# Patient Record
Sex: Female | Born: 1965 | Race: White | Hispanic: Yes | Marital: Married | Smoking: Never smoker
Health system: Southern US, Community
[De-identification: ages and names within clinical notes are randomized; demographics above are authoritative.]

## PROBLEM LIST (undated history)

## (undated) DIAGNOSIS — J069 Acute upper respiratory infection, unspecified: Secondary | ICD-10-CM

## (undated) DIAGNOSIS — R51 Headache: Secondary | ICD-10-CM

## (undated) DIAGNOSIS — N951 Menopausal and female climacteric states: Secondary | ICD-10-CM

## (undated) DIAGNOSIS — F419 Anxiety disorder, unspecified: Secondary | ICD-10-CM

## (undated) DIAGNOSIS — J45909 Unspecified asthma, uncomplicated: Secondary | ICD-10-CM

## (undated) DIAGNOSIS — F32A Depression, unspecified: Secondary | ICD-10-CM

## (undated) DIAGNOSIS — F329 Major depressive disorder, single episode, unspecified: Secondary | ICD-10-CM

## (undated) HISTORY — DX: Acute upper respiratory infection, unspecified: J06.9

---

## 1998-08-27 ENCOUNTER — Other Ambulatory Visit: Admission: RE | Admit: 1998-08-27 | Discharge: 1998-08-27 | Payer: Self-pay | Admitting: Obstetrics and Gynecology

## 1999-11-03 ENCOUNTER — Other Ambulatory Visit: Admission: RE | Admit: 1999-11-03 | Discharge: 1999-11-03 | Payer: Self-pay | Admitting: Obstetrics and Gynecology

## 2000-05-28 ENCOUNTER — Ambulatory Visit (HOSPITAL_COMMUNITY): Admission: RE | Admit: 2000-05-28 | Discharge: 2000-05-28 | Payer: Self-pay | Admitting: Obstetrics and Gynecology

## 2000-10-23 ENCOUNTER — Inpatient Hospital Stay (HOSPITAL_COMMUNITY): Admission: AD | Admit: 2000-10-23 | Discharge: 2000-10-25 | Payer: Self-pay | Admitting: Obstetrics and Gynecology

## 2000-11-16 ENCOUNTER — Other Ambulatory Visit: Admission: RE | Admit: 2000-11-16 | Discharge: 2000-11-16 | Payer: Self-pay | Admitting: Obstetrics and Gynecology

## 2001-07-20 ENCOUNTER — Encounter: Payer: Self-pay | Admitting: Allergy and Immunology

## 2001-07-20 ENCOUNTER — Encounter: Admission: RE | Admit: 2001-07-20 | Discharge: 2001-07-20 | Payer: Self-pay | Admitting: Allergy and Immunology

## 2004-01-24 ENCOUNTER — Other Ambulatory Visit: Admission: RE | Admit: 2004-01-24 | Discharge: 2004-01-24 | Payer: Self-pay | Admitting: Obstetrics and Gynecology

## 2005-02-05 ENCOUNTER — Other Ambulatory Visit: Admission: RE | Admit: 2005-02-05 | Discharge: 2005-02-05 | Payer: Self-pay | Admitting: Obstetrics and Gynecology

## 2005-05-06 ENCOUNTER — Ambulatory Visit (HOSPITAL_COMMUNITY): Admission: RE | Admit: 2005-05-06 | Discharge: 2005-05-06 | Payer: Self-pay | Admitting: Obstetrics and Gynecology

## 2006-03-30 ENCOUNTER — Other Ambulatory Visit: Admission: RE | Admit: 2006-03-30 | Discharge: 2006-03-30 | Payer: Self-pay | Admitting: Obstetrics and Gynecology

## 2006-11-05 ENCOUNTER — Ambulatory Visit (HOSPITAL_COMMUNITY): Admission: RE | Admit: 2006-11-05 | Discharge: 2006-11-05 | Payer: Self-pay | Admitting: Family Medicine

## 2006-11-22 ENCOUNTER — Encounter: Admission: RE | Admit: 2006-11-22 | Discharge: 2006-11-22 | Payer: Self-pay | Admitting: Family Medicine

## 2007-03-31 ENCOUNTER — Other Ambulatory Visit: Admission: RE | Admit: 2007-03-31 | Discharge: 2007-03-31 | Payer: Self-pay | Admitting: Obstetrics and Gynecology

## 2007-09-29 HISTORY — PX: CARPAL TUNNEL RELEASE: SHX101

## 2008-03-13 ENCOUNTER — Encounter: Admission: RE | Admit: 2008-03-13 | Discharge: 2008-03-13 | Payer: Self-pay | Admitting: Obstetrics and Gynecology

## 2008-04-26 ENCOUNTER — Other Ambulatory Visit: Admission: RE | Admit: 2008-04-26 | Discharge: 2008-04-26 | Payer: Self-pay | Admitting: Obstetrics and Gynecology

## 2008-09-19 ENCOUNTER — Ambulatory Visit (HOSPITAL_BASED_OUTPATIENT_CLINIC_OR_DEPARTMENT_OTHER): Admission: RE | Admit: 2008-09-19 | Discharge: 2008-09-19 | Payer: Self-pay | Admitting: Orthopedic Surgery

## 2009-04-19 ENCOUNTER — Encounter: Admission: RE | Admit: 2009-04-19 | Discharge: 2009-04-19 | Payer: Self-pay | Admitting: Family Medicine

## 2009-05-01 ENCOUNTER — Encounter: Admission: RE | Admit: 2009-05-01 | Discharge: 2009-05-01 | Payer: Self-pay | Admitting: Family Medicine

## 2009-05-16 ENCOUNTER — Other Ambulatory Visit: Admission: RE | Admit: 2009-05-16 | Discharge: 2009-05-16 | Payer: Self-pay | Admitting: Obstetrics and Gynecology

## 2010-05-09 ENCOUNTER — Other Ambulatory Visit: Admission: RE | Admit: 2010-05-09 | Discharge: 2010-05-09 | Payer: Self-pay | Admitting: Obstetrics and Gynecology

## 2010-05-13 ENCOUNTER — Encounter: Admission: RE | Admit: 2010-05-13 | Discharge: 2010-05-13 | Payer: Self-pay | Admitting: Obstetrics and Gynecology

## 2010-10-19 ENCOUNTER — Encounter: Payer: Self-pay | Admitting: Family Medicine

## 2011-02-10 NOTE — Op Note (Signed)
Colleen House, HITT                ACCOUNT NO.:  [id number]   MEDICAL RECORD NO.:  [id number]          PATIENT TYPE:  AMB   LOCATION:  [facility]SC                          FACILITY:  [facility]   PHYSICIAN:  Cindee Salt, M.D.       DATE OF BIRTH:  April 25, 1966   DATE OF PROCEDURE:  09/19/2008  DATE OF DISCHARGE:                               OPERATIVE REPORT   PREOPERATIVE DIAGNOSIS:  Carpal tunnel syndrome, right hand.   POSTOPERATIVE DIAGNOSIS:  Carpal tunnel syndrome, right hand.   OPERATION:  Decompression of right median nerve.   SURGEON:  Cindee Salt, MD   ASSISTANT:  Joaquin Courts, RN   ANESTHESIA:  Forearm-based IV regional.   DATE OF OPERATION:  September 19, 2008.   ANESTHESIOLOGIST:  Guadalupe Maple, MD.   HISTORY:  The patient is a 45 year old female with a history of carpal  tunnel syndrome, EMG nerve conductions positive which has not responded  to conservative treatment.  She has elected to undergo surgical  decompression.  Postoperative course was discussed along with risks and  complications.  She is aware that there is no guarantee with surgery,  possibility of infection, recurrence, injury to arteries, nerves, and  tendons, incomplete relief of symptoms, and dystrophy.  In the  preoperative area, the patient is seen.  The extremity marked by both  the patient and surgeon.  Antibiotic given.   PROCEDURE:  The patient was brought to the operating room where a  forearm-based IV regional anesthetic was carried out without difficulty.  She was prepped using DuraPrep, supine position, right arm free.  A time-  out was taken.  A longitudinal incision was made in the palm, carried  down through subcutaneous tissue.  Bleeders were electrocauterized.  Palmar fascia was split.  Superficial palmar arch identified.  The  flexor tendon to ring little finger identified to the ulnar side of the  median nerve.  Carpal retinaculum was incised with sharp dissection.  Right angle and Sewall  retractor were placed between skin and forearm  fascia.  Fascia was released for approximately a centimeter and half  proximal to the wrist crease under direct vision.  The canal was  explored.  Air compression to the nerve was apparent.  No further  lesions were identified.  The wound was irrigated.  Skin was closed with  interrupted 5-0 Vicryl Rapide sutures.  A sterile compressive dressing  and wrist splint were applied.  The patient tolerated the procedure well  and was taken to the recovery room for observation in satisfactory  condition.  She will be discharged home to return to [facility] of  [place] in 1 week on Vicodin.           ______________________________  Cindee Salt, M.D.     GK/MEDQ  D:  09/19/2008  T:  09/19/2008  Job:  [id number]   cc:   Dellis Anes. Idell Pickles, M.D.

## 2011-05-15 ENCOUNTER — Other Ambulatory Visit (HOSPITAL_COMMUNITY)
Admission: RE | Admit: 2011-05-15 | Discharge: 2011-05-15 | Disposition: A | Discharge status: Home / Self Care | Payer: BC Managed Care – PPO | Source: Ambulatory Visit | Attending: Emergency Medicine | Admitting: Emergency Medicine

## 2011-05-15 ENCOUNTER — Other Ambulatory Visit: Payer: Self-pay | Admitting: Obstetrics and Gynecology

## 2011-05-15 DIAGNOSIS — Z1231 Encounter for screening mammogram for malignant neoplasm of breast: Secondary | ICD-10-CM

## 2011-05-15 DIAGNOSIS — Z01419 Encounter for gynecological examination (general) (routine) without abnormal findings: Secondary | ICD-10-CM | POA: Insufficient documentation

## 2011-05-20 ENCOUNTER — Ambulatory Visit
Admission: RE | Admit: 2011-05-20 | Discharge: 2011-05-20 | Disposition: A | Discharge status: Home / Self Care | Payer: BC Managed Care – PPO | Source: Ambulatory Visit | Attending: Obstetrics and Gynecology | Admitting: Obstetrics and Gynecology

## 2011-05-20 DIAGNOSIS — Z1231 Encounter for screening mammogram for malignant neoplasm of breast: Secondary | ICD-10-CM

## 2012-05-12 ENCOUNTER — Other Ambulatory Visit (HOSPITAL_COMMUNITY)
Admission: RE | Admit: 2012-05-12 | Discharge: 2012-05-12 | Disposition: A | Discharge status: Home / Self Care | Payer: BC Managed Care – PPO | Source: Ambulatory Visit | Attending: Obstetrics and Gynecology | Admitting: Obstetrics and Gynecology

## 2012-05-12 ENCOUNTER — Other Ambulatory Visit: Payer: Self-pay | Admitting: Obstetrics and Gynecology

## 2012-05-12 DIAGNOSIS — Z01419 Encounter for gynecological examination (general) (routine) without abnormal findings: Secondary | ICD-10-CM | POA: Insufficient documentation

## 2012-05-12 DIAGNOSIS — Z1151 Encounter for screening for human papillomavirus (HPV): Secondary | ICD-10-CM | POA: Insufficient documentation

## 2012-06-28 IMAGING — MG MM DIGITAL SCREENING {BCG}
4 series · 4 of 4 positions shown · non-contrast
Comparison: Prior studies.

DG SCREEN MAMMOGRAM BILATERAL
Bilateral CC and MLO view(s) were taken.

DIGITAL SCREENING MAMMOGRAM WITH CAD:

[R CC]
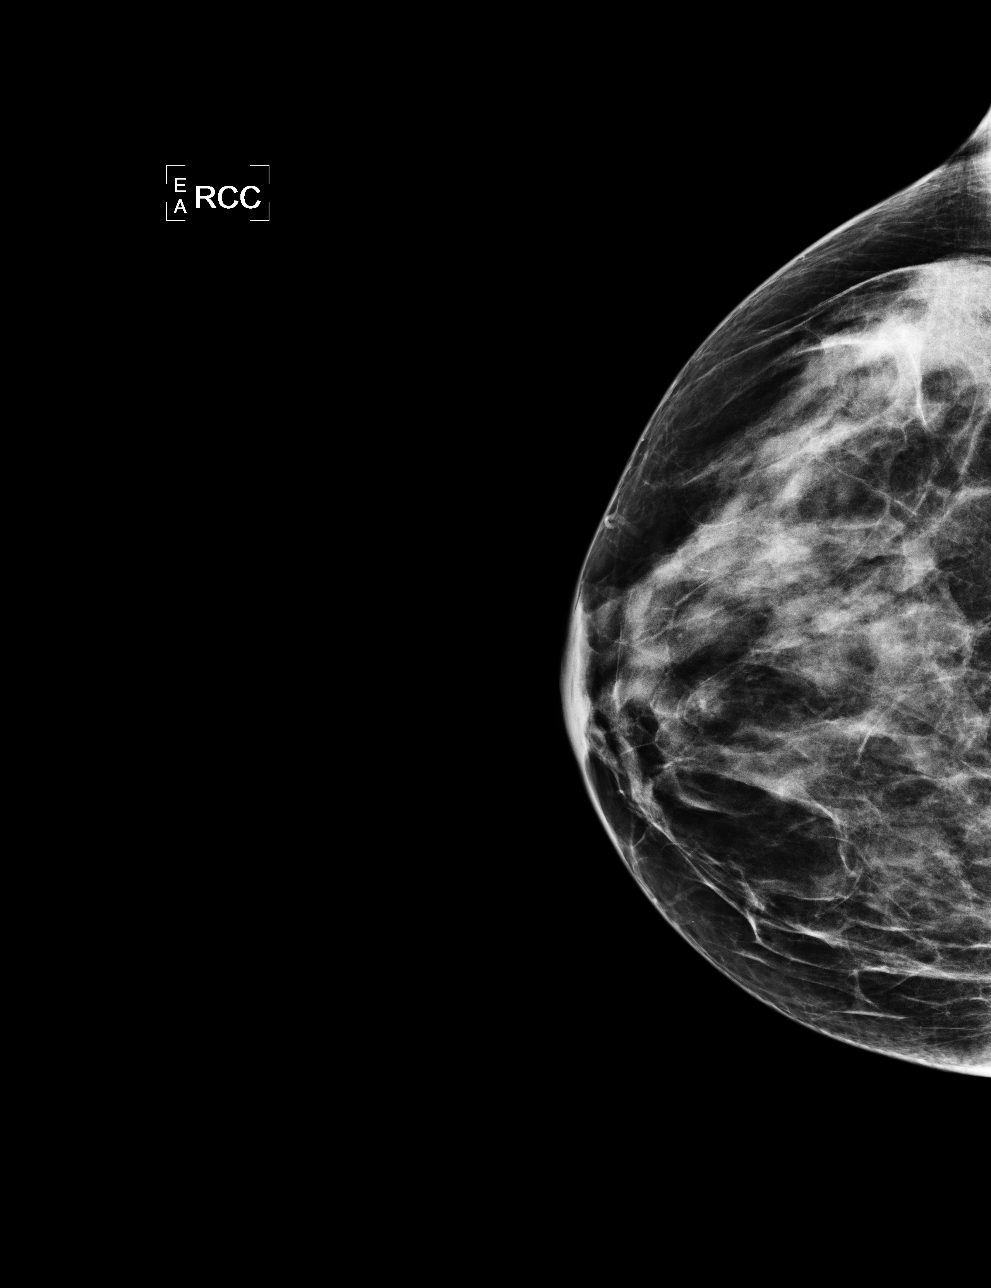

[L CC]
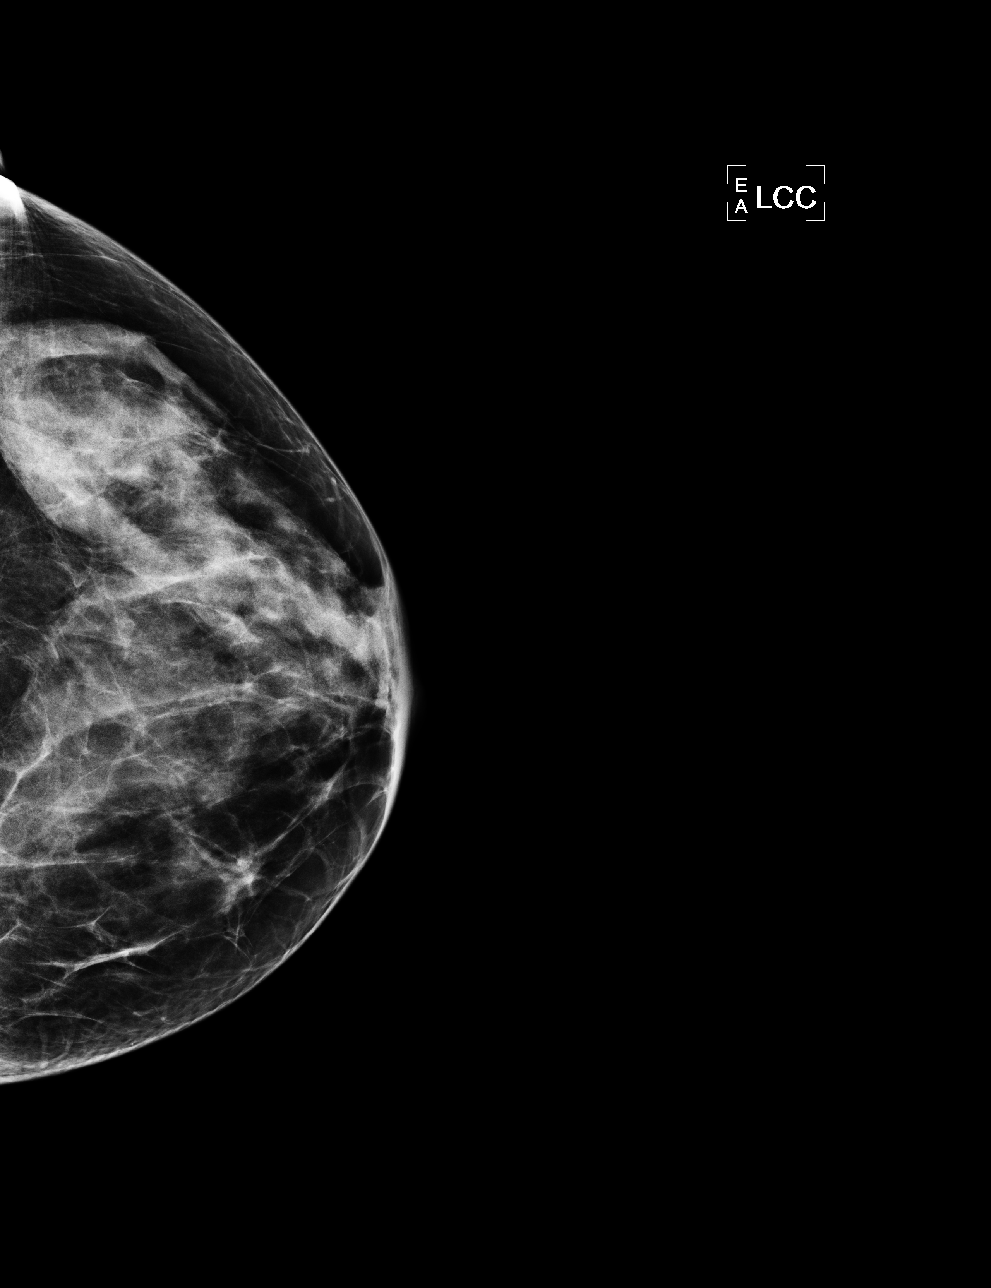

[L MLO]
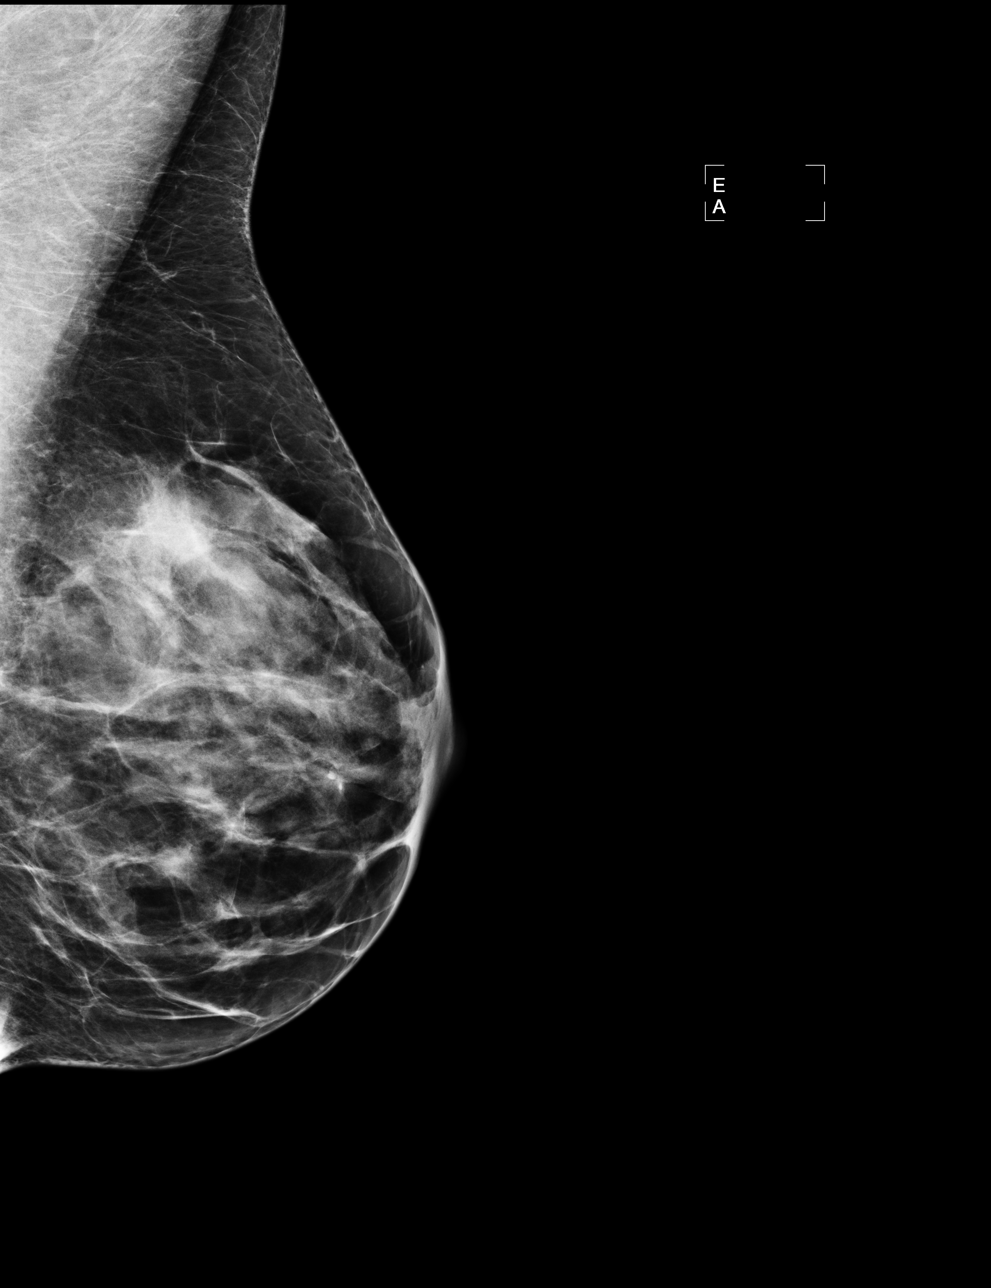

[R MLO]
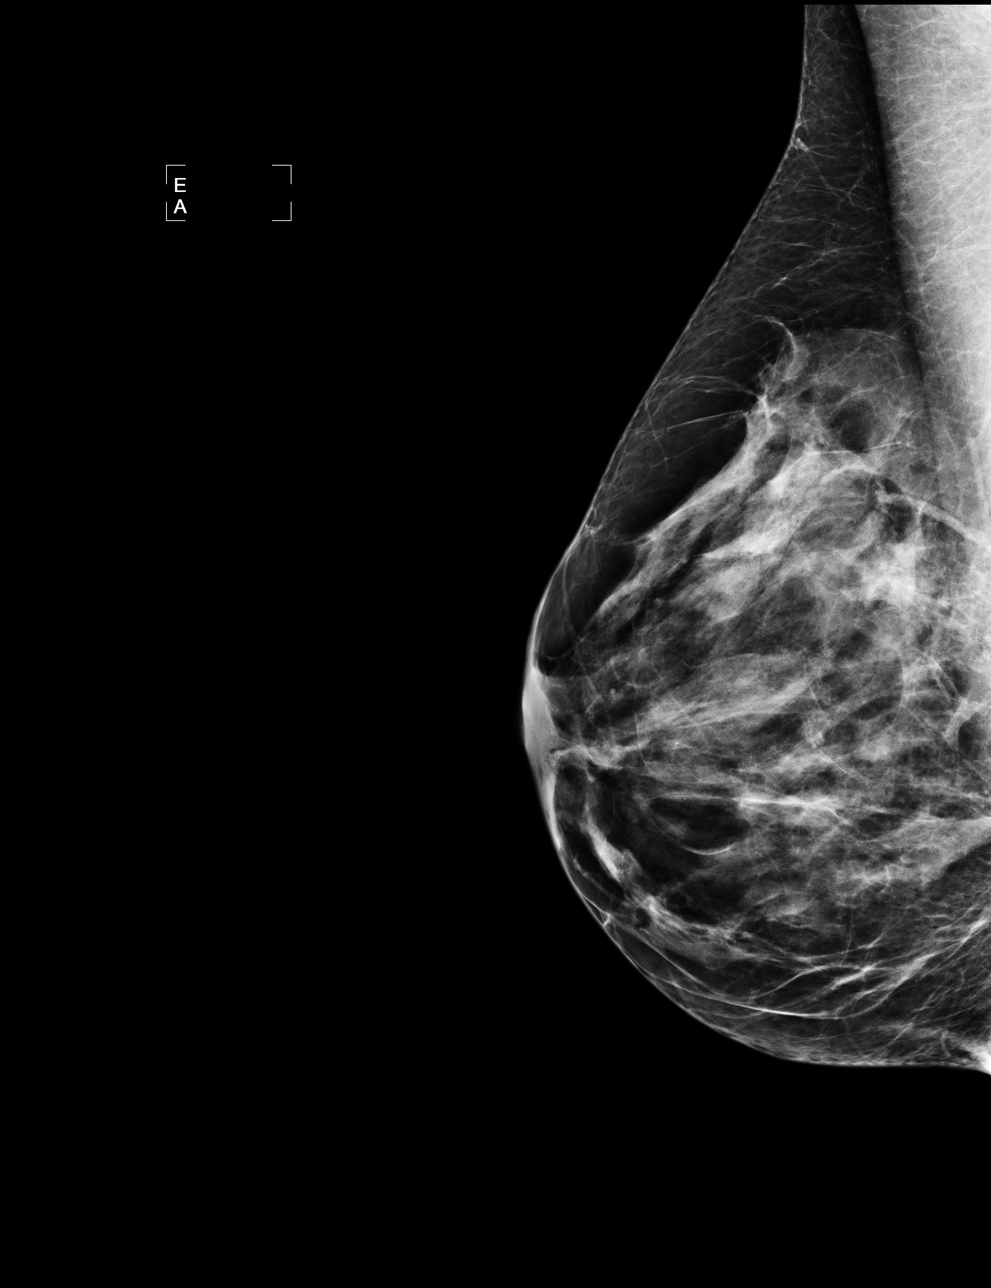

[4 of 4 positions shown; findings below may reference images not displayed]

The breast tissue is heterogeneously dense.  There is no dominant mass, architectural distortion or
calcification to suggest malignancy.

Images were processed with CAD.
IMPRESSION: No mammographic evidence of malignancy.  Suggest yearly screening mammography.

A result letter of this screening mammogram will be mailed directly to the patient.

ASSESSMENT: Negative - BI-RADS 1

Screening mammogram in 1 year.
,

## 2012-08-22 ENCOUNTER — Other Ambulatory Visit: Payer: Self-pay | Admitting: Obstetrics and Gynecology

## 2012-08-22 DIAGNOSIS — Z1231 Encounter for screening mammogram for malignant neoplasm of breast: Secondary | ICD-10-CM

## 2012-11-04 ENCOUNTER — Ambulatory Visit: Payer: BC Managed Care – PPO

## 2013-07-12 ENCOUNTER — Other Ambulatory Visit: Payer: Self-pay | Admitting: Orthopedic Surgery

## 2013-07-13 ENCOUNTER — Encounter (HOSPITAL_BASED_OUTPATIENT_CLINIC_OR_DEPARTMENT_OTHER): Payer: Self-pay | Admitting: *Deleted

## 2013-07-13 NOTE — Progress Notes (Signed)
No labs needed

## 2013-07-17 ENCOUNTER — Ambulatory Visit (HOSPITAL_BASED_OUTPATIENT_CLINIC_OR_DEPARTMENT_OTHER): Payer: BC Managed Care – PPO | Admitting: Anesthesiology

## 2013-07-17 ENCOUNTER — Encounter (HOSPITAL_BASED_OUTPATIENT_CLINIC_OR_DEPARTMENT_OTHER): Payer: BC Managed Care – PPO | Admitting: Anesthesiology

## 2013-07-17 ENCOUNTER — Encounter (HOSPITAL_BASED_OUTPATIENT_CLINIC_OR_DEPARTMENT_OTHER): Payer: Self-pay | Admitting: *Deleted

## 2013-07-17 ENCOUNTER — Ambulatory Visit (HOSPITAL_BASED_OUTPATIENT_CLINIC_OR_DEPARTMENT_OTHER)
Admission: RE | Admit: 2013-07-17 | Discharge: 2013-07-17 | Disposition: A | Discharge status: Home / Self Care | Payer: BC Managed Care – PPO | Source: Ambulatory Visit | Attending: Orthopedic Surgery | Admitting: Orthopedic Surgery

## 2013-07-17 ENCOUNTER — Encounter (HOSPITAL_BASED_OUTPATIENT_CLINIC_OR_DEPARTMENT_OTHER)
Admission: RE | Disposition: A | Discharge status: Home / Self Care | Payer: Self-pay | Source: Ambulatory Visit | Attending: Orthopedic Surgery

## 2013-07-17 DIAGNOSIS — J45909 Unspecified asthma, uncomplicated: Secondary | ICD-10-CM | POA: Insufficient documentation

## 2013-07-17 DIAGNOSIS — Z79899 Other long term (current) drug therapy: Secondary | ICD-10-CM | POA: Insufficient documentation

## 2013-07-17 DIAGNOSIS — Z01812 Encounter for preprocedural laboratory examination: Secondary | ICD-10-CM | POA: Insufficient documentation

## 2013-07-17 DIAGNOSIS — F411 Generalized anxiety disorder: Secondary | ICD-10-CM | POA: Insufficient documentation

## 2013-07-17 DIAGNOSIS — R51 Headache: Secondary | ICD-10-CM | POA: Insufficient documentation

## 2013-07-17 DIAGNOSIS — F329 Major depressive disorder, single episode, unspecified: Secondary | ICD-10-CM | POA: Insufficient documentation

## 2013-07-17 DIAGNOSIS — M6530 Trigger finger, unspecified finger: Secondary | ICD-10-CM | POA: Insufficient documentation

## 2013-07-17 DIAGNOSIS — F3289 Other specified depressive episodes: Secondary | ICD-10-CM | POA: Insufficient documentation

## 2013-07-17 HISTORY — PX: TRIGGER FINGER RELEASE: SHX641

## 2013-07-17 HISTORY — DX: Major depressive disorder, single episode, unspecified: F32.9

## 2013-07-17 HISTORY — DX: Headache: R51

## 2013-07-17 HISTORY — DX: Anxiety disorder, unspecified: F41.9

## 2013-07-17 HISTORY — DX: Unspecified asthma, uncomplicated: J45.909

## 2013-07-17 HISTORY — DX: Depression, unspecified: F32.A

## 2013-07-17 HISTORY — DX: Menopausal and female climacteric states: N95.1

## 2013-07-17 SURGERY — RELEASE, A1 PULLEY, FOR TRIGGER FINGER
Anesthesia: General | Site: Hand | Laterality: Right | Wound class: Clean

## 2013-07-17 MED ORDER — CEFAZOLIN SODIUM-DEXTROSE 2-3 GM-% IV SOLR
INTRAVENOUS | Status: AC
  Filled 2013-07-17: qty 50

## 2013-07-17 MED ORDER — CHLORHEXIDINE GLUCONATE 4 % EX LIQD: 60.0000 mL | Freq: Once | CUTANEOUS | Status: DC

## 2013-07-17 MED ORDER — CEFAZOLIN SODIUM-DEXTROSE 2-3 GM-% IV SOLR
2.0000 g | INTRAVENOUS | Status: AC
  Administered 2013-07-17: 2 g via INTRAVENOUS

## 2013-07-17 MED ORDER — FENTANYL CITRATE 0.05 MG/ML IJ SOLN
INTRAMUSCULAR | Status: AC
  Filled 2013-07-17: qty 2

## 2013-07-17 MED ORDER — LIDOCAINE HCL (PF) 0.5 % IJ SOLN
INTRAMUSCULAR | Status: DC | PRN
  Administered 2013-07-17: 35 mL via INTRAVENOUS

## 2013-07-17 MED ORDER — BUPIVACAINE HCL (PF) 0.25 % IJ SOLN
INTRAMUSCULAR | Status: AC
  Filled 2013-07-17: qty 30

## 2013-07-17 MED ORDER — LACTATED RINGERS IV SOLN
INTRAVENOUS | Status: DC
  Administered 2013-07-17: 13:00:00 via INTRAVENOUS

## 2013-07-17 MED ORDER — ONDANSETRON HCL 4 MG/2ML IJ SOLN
INTRAMUSCULAR | Status: DC | PRN
  Administered 2013-07-17: 4 mg via INTRAVENOUS

## 2013-07-17 MED ORDER — MIDAZOLAM HCL 2 MG/2ML IJ SOLN
INTRAMUSCULAR | Status: AC
  Filled 2013-07-17: qty 2

## 2013-07-17 MED ORDER — BUPIVACAINE HCL (PF) 0.25 % IJ SOLN
INTRAMUSCULAR | Status: DC | PRN
  Administered 2013-07-17: 3.5 mL

## 2013-07-17 MED ORDER — MIDAZOLAM HCL 5 MG/5ML IJ SOLN
INTRAMUSCULAR | Status: DC | PRN
  Administered 2013-07-17: 2 mg via INTRAVENOUS

## 2013-07-17 MED ORDER — LIDOCAINE HCL (PF) 1 % IJ SOLN
INTRAMUSCULAR | Status: AC
  Filled 2013-07-17: qty 30

## 2013-07-17 MED ORDER — PROPOFOL 10 MG/ML IV BOLUS
INTRAVENOUS | Status: DC | PRN
  Administered 2013-07-17: 30 mg via INTRAVENOUS

## 2013-07-17 MED ORDER — HYDROMORPHONE HCL PF 1 MG/ML IJ SOLN: 0.2500 mg | INTRAMUSCULAR | Status: DC | PRN

## 2013-07-17 MED ORDER — KETOROLAC TROMETHAMINE 30 MG/ML IJ SOLN
INTRAMUSCULAR | Status: DC | PRN
  Administered 2013-07-17: 30 mg via INTRAVENOUS

## 2013-07-17 MED ORDER — KETOROLAC TROMETHAMINE 30 MG/ML IJ SOLN: 15.0000 mg | Freq: Once | INTRAMUSCULAR | Status: DC | PRN

## 2013-07-17 MED ORDER — LIDOCAINE HCL (CARDIAC) 20 MG/ML IV SOLN
INTRAVENOUS | Status: DC | PRN
  Administered 2013-07-17: 30 mg via INTRAVENOUS

## 2013-07-17 MED ORDER — FENTANYL CITRATE 0.05 MG/ML IJ SOLN
INTRAMUSCULAR | Status: DC | PRN
  Administered 2013-07-17 (×2): 50 ug via INTRAVENOUS

## 2013-07-17 MED ORDER — ONDANSETRON HCL 4 MG/2ML IJ SOLN: 4.0000 mg | Freq: Once | INTRAMUSCULAR | Status: DC | PRN

## 2013-07-17 MED ORDER — PROPOFOL 10 MG/ML IV BOLUS
INTRAVENOUS | Status: AC
  Filled 2013-07-17: qty 20

## 2013-07-17 MED ORDER — HYDROCODONE-ACETAMINOPHEN 5-325 MG PO TABS: 1.0000 | ORAL_TABLET | Freq: Four times a day (QID) | ORAL | Status: DC | PRN

## 2013-07-17 SURGICAL SUPPLY — 33 items
BANDAGE COBAN STERILE 2 (GAUZE/BANDAGES/DRESSINGS) ×2 IMPLANT
BLADE SURG 15 STRL LF DISP TIS (BLADE) ×1 IMPLANT
BLADE SURG 15 STRL SS (BLADE) ×2
BNDG CMPR 9X4 STRL LF SNTH (GAUZE/BANDAGES/DRESSINGS)
BNDG ESMARK 4X9 LF (GAUZE/BANDAGES/DRESSINGS) IMPLANT
CHLORAPREP W/TINT 26ML (MISCELLANEOUS) ×2 IMPLANT
CORDS BIPOLAR (ELECTRODE) IMPLANT
COVER MAYO STAND STRL (DRAPES) ×2 IMPLANT
COVER TABLE BACK 60X90 (DRAPES) ×2 IMPLANT
CUFF TOURNIQUET SINGLE 18IN (TOURNIQUET CUFF) ×1 IMPLANT
DECANTER SPIKE VIAL GLASS SM (MISCELLANEOUS) IMPLANT
DRAPE EXTREMITY T 121X128X90 (DRAPE) ×2 IMPLANT
DRAPE SURG 17X23 STRL (DRAPES) ×2 IMPLANT
GAUZE XEROFORM 1X8 LF (GAUZE/BANDAGES/DRESSINGS) ×2 IMPLANT
GLOVE BIOGEL PI IND STRL 8.5 (GLOVE) ×1 IMPLANT
GLOVE BIOGEL PI INDICATOR 8.5 (GLOVE) ×1
GLOVE ECLIPSE 6.5 STRL STRAW (GLOVE) ×1 IMPLANT
GLOVE EXAM NITRILE EXT CUFF MD (GLOVE) ×1 IMPLANT
GLOVE SURG ORTHO 8.0 STRL STRW (GLOVE) ×2 IMPLANT
GOWN BRE IMP PREV XXLGXLNG (GOWN DISPOSABLE) ×2 IMPLANT
GOWN PREVENTION PLUS XLARGE (GOWN DISPOSABLE) ×2 IMPLANT
NEEDLE 27GAX1X1/2 (NEEDLE) ×2 IMPLANT
NS IRRIG 1000ML POUR BTL (IV SOLUTION) ×2 IMPLANT
PACK BASIN DAY SURGERY FS (CUSTOM PROCEDURE TRAY) ×2 IMPLANT
PADDING CAST ABS 4INX4YD NS (CAST SUPPLIES)
PADDING CAST ABS COTTON 4X4 ST (CAST SUPPLIES) ×1 IMPLANT
SPONGE GAUZE 4X4 12PLY (GAUZE/BANDAGES/DRESSINGS) ×2 IMPLANT
STOCKINETTE 4X48 STRL (DRAPES) ×2 IMPLANT
SUT VICRYL RAPIDE 4/0 PS 2 (SUTURE) ×2 IMPLANT
SYR BULB 3OZ (MISCELLANEOUS) ×2 IMPLANT
SYR CONTROL 10ML LL (SYRINGE) ×2 IMPLANT
TOWEL OR 17X24 6PK STRL BLUE (TOWEL DISPOSABLE) ×4 IMPLANT
UNDERPAD 30X30 INCONTINENT (UNDERPADS AND DIAPERS) ×2 IMPLANT

## 2013-07-17 NOTE — Brief Op Note (Signed)
07/17/2013  2:03 PM  PATIENT:  Colleen House  47 y.o. female  PRE-OPERATIVE DIAGNOSIS:  STENOSING TENOSYNOVITIS RIGHT THUMB  POST-OPERATIVE DIAGNOSIS:  Stenosing Tenosynovitis Right Thumb  PROCEDURE:  Procedure(s): RELEASE A-1 PULLEY  RIGHT THUMB (Right)  SURGEON:  Surgeon(s) and Role:    * Nicki Reaper, MD - Primary  PHYSICIAN ASSISTANT:   ASSISTANTS: none   ANESTHESIA:   local and regional  EBL:  Total I/O In: 500 [I.V.:500] Out: -   BLOOD ADMINISTERED:none  DRAINS: none   LOCAL MEDICATIONS USED:  MARCAINE     SPECIMEN:  No Specimen  DISPOSITION OF SPECIMEN:  N/A  COUNTS:  YES  TOURNIQUET:   Total Tourniquet Time Documented: Forearm (Right) - 75 minutes Total: Forearm (Right) - 75 minutes   DICTATION: .Other Dictation: Dictation Number [id number]  PLAN OF CARE: Discharge to home after PACU  PATIENT DISPOSITION:  PACU - hemodynamically stable.

## 2013-07-17 NOTE — H&P (Signed)
Colleen House is 47 years-old.  She is right-hand dominant.  She has not been seen in several years.  She complains of pain and catching in her right thumb.  This has been going on since the beginning of April. She recalls no history of injury. She states that she has begun guitar lessons and this may be the underlying etiology.  She complains of catching especially in the morning with sharp pain. She is not complaining of any numbness or tingling. She has worn a splint she obtained which has given her some relief.  She has been taking Aleve.  This has been injected on two occasions with good relief, but it has returned.   PAST MEDICAL HISTORY:  She has history of diabetes, thyroid problems, arthritis or gout.  ALLERGIES:   Erythromycin.    MEDICATIONS:    Lamotrigine, Adderall and escitalopram.    SURGICAL HISTORY:    She has had carpal tunnel release on her right side in December of 2010.   FAMILY MEDICAL HISTORY:    Negative.  SOCIAL HISTORY:     She does not smoke or drink.  She is a Architectural technologist.  REVIEW OF SYSTEMS:    Positive for glasses, asthma, headaches, depression, otherwise negative 14 points. Colleen House is an 47 y.o. female.   Chief Complaint: sts rt thumb HPI: see above  Past Medical History  Diagnosis Date  . Headache(784.0)   . Anxiety   . Depression   . Asthma     very mild-smoke triggers it  . Perimenopausal     Past Surgical History  Procedure Laterality Date  . Carpal tunnel release  2009    right=[facility]ional    History reviewed. No pertinent family history. Social History:  reports that she has never smoked. She does not have any smokeless tobacco history on file. She reports that she drinks alcohol. She reports that she does not use illicit drugs.  Allergies:  Allergies  Allergen Reactions  . Erythromycin     Made her chest hurt    Medications Prior to Admission  Medication Sig Dispense Refill  . eletriptan (RELPAX) 20 MG tablet Take 20  mg by mouth as needed for migraine. One tablet by mouth at onset of headache. May repeat in 2 hours if headache persists or recurs.      Marland Kitchen escitalopram (LEXAPRO) 20 MG tablet Take 20 mg by mouth every evening.      . lamoTRIgine (LAMICTAL) 200 MG tablet Take 200 mg by mouth every evening.      . mometasone (NASONEX) 50 MCG/ACT nasal spray Place 2 sprays into the nose as needed.      Marland Kitchen albuterol (PROVENTIL HFA;VENTOLIN HFA) 108 (90 BASE) MCG/ACT inhaler Inhale 2 puffs into the lungs every 6 (six) hours as needed for wheezing.        No results found for this or any previous visit (from the past 48 hour(s)).  No results found.   Pertinent items are noted in HPI.  Blood pressure 105/72, pulse 72, temperature 98.2 F (36.8 C), temperature source Oral, resp. rate 16, height 5\' 3"  (1.6 m), weight 149 lb (67.586 kg), last menstrual period 05/15/2013, SpO2 99.00%.  General appearance: alert, cooperative and appears stated age Head: Normocephalic, without obvious abnormality Neck: no JVD Resp: clear to auscultation bilaterally Cardio: regular rate and rhythm, S1, S2 normal, no murmur, click, rub or gallop GI: soft, non-tender; bowel sounds normal; no masses,  no organomegaly Extremities: extremities normal, atraumatic, no cyanosis  or edema Pulses: 2+ and symmetric Skin: Skin color, texture, turgor normal. No rashes or lesions Neurologic: Grossly normal Incision/Wound: na  Assessment/Plan DIAGNOSIS:     Stenosing tenosynovitis, right thumb.  Plan Release A-1 Rt thumb  Bertie Mcconathy R 07/17/2013, 1:02 PM

## 2013-07-17 NOTE — Anesthesia Postprocedure Evaluation (Signed)
  Anesthesia Post-op Note  Patient: Colleen House  Procedure(s) Performed: Procedure(s): RELEASE A-1 PULLEY  RIGHT THUMB (Right)  Patient Location: PACU  Anesthesia Type:Bier block  Level of Consciousness: awake, alert  and oriented  Airway and Oxygen Therapy: Patient Spontanous Breathing  Post-op Pain: none  Post-op Assessment: Post-op Vital signs reviewed, Patient's Cardiovascular Status Stable, Respiratory Function Stable, Patent Airway and Pain level controlled  Post-op Vital Signs: stable  Complications: No apparent anesthesia complications

## 2013-07-17 NOTE — Anesthesia Preprocedure Evaluation (Addendum)
Anesthesia Evaluation  Patient identified by MRN, date of birth, ID band Patient awake    Reviewed: Allergy & Precautions, H&P , NPO status , Patient's Chart, lab work & pertinent test results  Airway Mallampati: II TM Distance: >3 FB Neck ROM: Full    Dental  (+) Teeth Intact and Dental Advisory Given   Pulmonary  breath sounds clear to auscultation        Cardiovascular Rhythm:Regular Rate:Normal     Neuro/Psych    GI/Hepatic   Endo/Other    Renal/GU      Musculoskeletal   Abdominal   Peds  Hematology   Anesthesia Other Findings   Reproductive/Obstetrics                           Anesthesia Physical Anesthesia Plan  ASA: II  Anesthesia Plan: General   Post-op Pain Management:    Induction: Intravenous  Airway Management Planned: LMA  Additional Equipment:   Intra-op Plan:   Post-operative Plan: Extubation in OR  Informed Consent: I have reviewed the patients History and Physical, chart, labs and discussed the procedure including the risks, benefits and alternatives for the proposed anesthesia with the patient or authorized representative who has indicated his/her understanding and acceptance.   Dental advisory given  Plan Discussed with: CRNA and Anesthesiologist  Anesthesia Plan Comments:         Anesthesia Quick Evaluation

## 2013-07-17 NOTE — Op Note (Signed)
Dictation Number [id number]

## 2013-07-17 NOTE — Anesthesia Procedure Notes (Signed)
Procedure Name: MAC Performed by: Adasia Hoar W Pre-anesthesia Checklist: Patient identified, Timeout performed, Emergency Drugs available, Suction available and Patient being monitored Patient Re-evaluated:Patient Re-evaluated prior to inductionOxygen Delivery Method: Simple face mask Placement Confirmation: positive ETCO2 Dental Injury: Teeth and Oropharynx as per pre-operative assessment      

## 2013-07-17 NOTE — Transfer of Care (Signed)
Immediate Anesthesia Transfer of Care Note  Patient: Colleen House  Procedure(s) Performed: Procedure(s): RELEASE A-1 PULLEY  RIGHT THUMB (Right)  Patient Location: PACU  Anesthesia Type:MAC and Bier block  Level of Consciousness: awake, alert  and oriented  Airway & Oxygen Therapy: Patient Spontanous Breathing and Patient connected to face mask oxygen  Post-op Assessment: Report given to PACU RN and Post -op Vital signs reviewed and stable  Post vital signs: Reviewed and stable  Complications: No apparent anesthesia complications

## 2013-07-18 ENCOUNTER — Encounter (HOSPITAL_BASED_OUTPATIENT_CLINIC_OR_DEPARTMENT_OTHER): Payer: Self-pay | Admitting: Orthopedic Surgery

## 2013-07-18 NOTE — Op Note (Signed)
NAMEElsie House                ACCOUNT NO.:  [id number]  MEDICAL RECORD NO.:  [id number]  LOCATION:                                 FACILITY:  PHYSICIAN:  Cindee Salt, M.D.            DATE OF BIRTH:  DATE OF PROCEDURE:  07/17/2013 DATE OF DISCHARGE:                              OPERATIVE REPORT   PREOPERATIVE DIAGNOSIS:  Stenosing tenosynovitis, right thumb.  POSTOPERATIVE DIAGNOSIS:  Stenosing tenosynovitis, right thumb.  OPERATION:  Release of A1 pulley, right thumb.  SURGEON:  Cindee Salt, M.D.  ANESTHESIA:  Forearm-based IV regional with local infiltration.  ANESTHESIOLOGIST:  Kaylyn Layer. Michelle Piper, M.D.  HISTORY:  The patient is a 47 year old female with a history of triggering of her right thumb, this is not responded to conservative treatment including two injections.  She has elected to undergo surgical decompression of the A1 pulley.  Pre, peri, and postoperative course have been discussed along with risks and complications.  She is aware that there is no guarantee with surgery; possibility of infection; recurrence of injury to arteries, nerves, tendons; incomplete relief of symptoms and dystrophy.  In the preoperative area, the patient is seen, the extremity marked by both patient and surgeon, and antibiotic given.  DESCRIPTION OF PROCEDURE:  The patient was brought to the operating room where a forearm-based IV regional anesthetic was carried out without difficulty.  She was prepped using ChloraPrep, supine position with the right arm free.  A 3-minute dry time was allowed.  Time-out taken, confirming the patient and procedure.  A transverse incision was made over the A1 pulley of the right thumb.  After local infiltration with 0.25% Marcaine without epinephrine was given, the A1 pulley was identified.  Retractors were placed protecting both radial and ulnar neurovascular bundles.  The A1 pulley was then released on its radial aspect.  The oblique pulley was left  intact.  A partial tenosynovectomy was performed proximally with blunt dissection.  Thumb was placed through a full range motion, no further triggering was noted.  The wound was copiously irrigated with saline and closed with interrupted 4-0 Vicryl Rapide sutures.  A further infiltration with 0.25% Marcaine without epinephrine was given, approximately 4-5 mL was used.  A sterile compressive dressing with the thumb free was applied.  On deflation of the tourniquet, all fingers were immediately pinked.  She was taken to the recovery room for observation in satisfactory condition.  She will be discharged to home to return to the [facility] of [place] in 1 week, on Vicodin.          ______________________________ Cindee Salt, M.D.     GK/MEDQ  D:  07/17/2013  T:  07/18/2013  Job:  [id number]

## 2014-05-01 ENCOUNTER — Other Ambulatory Visit (HOSPITAL_COMMUNITY)
Admission: RE | Admit: 2014-05-01 | Discharge: 2014-05-01 | Disposition: A | Discharge status: Home / Self Care | Payer: BC Managed Care – PPO | Source: Ambulatory Visit | Attending: Obstetrics and Gynecology | Admitting: Obstetrics and Gynecology

## 2014-05-01 ENCOUNTER — Other Ambulatory Visit: Payer: Self-pay | Admitting: Obstetrics and Gynecology

## 2014-05-01 DIAGNOSIS — Z01419 Encounter for gynecological examination (general) (routine) without abnormal findings: Secondary | ICD-10-CM | POA: Insufficient documentation

## 2014-05-03 LAB — CYTOLOGY - PAP

## 2014-07-18 ENCOUNTER — Other Ambulatory Visit: Payer: Self-pay

## 2014-07-18 DIAGNOSIS — Z1231 Encounter for screening mammogram for malignant neoplasm of breast: Secondary | ICD-10-CM

## 2014-11-05 ENCOUNTER — Ambulatory Visit
Admission: RE | Admit: 2014-11-05 | Discharge: 2014-11-05 | Disposition: A | Discharge status: Home / Self Care | Payer: BC Managed Care – PPO | Source: Ambulatory Visit

## 2014-11-05 DIAGNOSIS — Z1231 Encounter for screening mammogram for malignant neoplasm of breast: Secondary | ICD-10-CM

## 2015-05-10 ENCOUNTER — Other Ambulatory Visit: Payer: Self-pay | Admitting: Obstetrics and Gynecology

## 2015-05-10 ENCOUNTER — Other Ambulatory Visit (HOSPITAL_COMMUNITY)
Admission: RE | Admit: 2015-05-10 | Discharge: 2015-05-10 | Disposition: A | Discharge status: Home / Self Care | Payer: BC Managed Care – PPO | Source: Ambulatory Visit | Attending: Obstetrics and Gynecology | Admitting: Obstetrics and Gynecology

## 2015-05-10 DIAGNOSIS — Z01419 Encounter for gynecological examination (general) (routine) without abnormal findings: Secondary | ICD-10-CM | POA: Diagnosis not present

## 2015-05-10 DIAGNOSIS — Z1151 Encounter for screening for human papillomavirus (HPV): Secondary | ICD-10-CM | POA: Diagnosis present

## 2015-05-13 LAB — CYTOLOGY - PAP

## 2015-12-26 ENCOUNTER — Other Ambulatory Visit: Payer: Self-pay

## 2015-12-26 DIAGNOSIS — Z1231 Encounter for screening mammogram for malignant neoplasm of breast: Secondary | ICD-10-CM

## 2016-01-17 ENCOUNTER — Ambulatory Visit
Admission: RE | Admit: 2016-01-17 | Discharge: 2016-01-17 | Disposition: A | Discharge status: Home / Self Care | Payer: BC Managed Care – PPO | Source: Ambulatory Visit

## 2016-01-17 DIAGNOSIS — Z1231 Encounter for screening mammogram for malignant neoplasm of breast: Secondary | ICD-10-CM

## 2017-01-21 ENCOUNTER — Other Ambulatory Visit: Payer: Self-pay | Admitting: Obstetrics and Gynecology

## 2017-01-21 DIAGNOSIS — Z1231 Encounter for screening mammogram for malignant neoplasm of breast: Secondary | ICD-10-CM

## 2017-02-08 ENCOUNTER — Encounter: Payer: Self-pay | Admitting: Radiology

## 2017-02-08 ENCOUNTER — Ambulatory Visit
Admission: RE | Admit: 2017-02-08 | Discharge: 2017-02-08 | Disposition: A | Discharge status: Home / Self Care | Payer: BC Managed Care – PPO | Source: Ambulatory Visit | Attending: Obstetrics and Gynecology | Admitting: Obstetrics and Gynecology

## 2017-02-08 DIAGNOSIS — Z1231 Encounter for screening mammogram for malignant neoplasm of breast: Secondary | ICD-10-CM

## 2018-02-04 ENCOUNTER — Other Ambulatory Visit: Payer: Self-pay | Admitting: Obstetrics and Gynecology

## 2018-02-04 DIAGNOSIS — Z1231 Encounter for screening mammogram for malignant neoplasm of breast: Secondary | ICD-10-CM

## 2018-02-28 ENCOUNTER — Ambulatory Visit
Admission: RE | Admit: 2018-02-28 | Discharge: 2018-02-28 | Disposition: A | Discharge status: Home / Self Care | Payer: BC Managed Care – PPO | Source: Ambulatory Visit | Attending: Obstetrics and Gynecology | Admitting: Obstetrics and Gynecology

## 2018-02-28 DIAGNOSIS — Z1231 Encounter for screening mammogram for malignant neoplasm of breast: Secondary | ICD-10-CM

## 2018-12-06 ENCOUNTER — Other Ambulatory Visit (HOSPITAL_COMMUNITY)
Admission: RE | Admit: 2018-12-06 | Discharge: 2018-12-06 | Disposition: A | Discharge status: Home / Self Care | Payer: BC Managed Care – PPO | Source: Ambulatory Visit | Attending: Obstetrics and Gynecology | Admitting: Obstetrics and Gynecology

## 2018-12-06 ENCOUNTER — Other Ambulatory Visit: Payer: Self-pay | Admitting: Obstetrics and Gynecology

## 2018-12-06 DIAGNOSIS — Z01419 Encounter for gynecological examination (general) (routine) without abnormal findings: Secondary | ICD-10-CM | POA: Diagnosis not present

## 2018-12-08 LAB — CYTOLOGY - PAP
Diagnosis: NEGATIVE
HPV: NOT DETECTED

## 2018-12-27 ENCOUNTER — Other Ambulatory Visit: Payer: Self-pay | Admitting: Obstetrics and Gynecology

## 2018-12-27 DIAGNOSIS — Z1231 Encounter for screening mammogram for malignant neoplasm of breast: Secondary | ICD-10-CM

## 2019-06-02 ENCOUNTER — Ambulatory Visit
Admission: RE | Admit: 2019-06-02 | Discharge: 2019-06-02 | Disposition: A | Discharge status: Home / Self Care | Payer: BC Managed Care – PPO | Source: Ambulatory Visit | Attending: Obstetrics and Gynecology | Admitting: Obstetrics and Gynecology

## 2019-06-02 ENCOUNTER — Other Ambulatory Visit: Payer: Self-pay

## 2019-06-02 DIAGNOSIS — Z1231 Encounter for screening mammogram for malignant neoplasm of breast: Secondary | ICD-10-CM

## 2019-08-04 ENCOUNTER — Other Ambulatory Visit: Payer: Self-pay

## 2019-08-04 DIAGNOSIS — Z20822 Contact with and (suspected) exposure to covid-19: Secondary | ICD-10-CM

## 2019-08-05 LAB — NOVEL CORONAVIRUS, NAA: SARS-CoV-2, NAA: NOT DETECTED

## 2020-01-11 ENCOUNTER — Encounter: Payer: Self-pay | Admitting: Allergy

## 2020-01-11 ENCOUNTER — Other Ambulatory Visit: Payer: Self-pay

## 2020-01-11 ENCOUNTER — Telehealth: Payer: Self-pay

## 2020-01-11 ENCOUNTER — Ambulatory Visit: Payer: BC Managed Care – PPO | Admitting: Allergy

## 2020-01-11 VITALS — BP 118/58 | HR 67 | Temp 97.3°F | Resp 16 | Ht 62.9 in | Wt 142.0 lb

## 2020-01-11 DIAGNOSIS — R05 Cough: Secondary | ICD-10-CM

## 2020-01-11 DIAGNOSIS — J3089 Other allergic rhinitis: Secondary | ICD-10-CM | POA: Diagnosis not present

## 2020-01-11 DIAGNOSIS — R053 Chronic cough: Secondary | ICD-10-CM | POA: Insufficient documentation

## 2020-01-11 DIAGNOSIS — J4540 Moderate persistent asthma, uncomplicated: Secondary | ICD-10-CM

## 2020-01-11 MED ORDER — AZELASTINE-FLUTICASONE 137-50 MCG/ACT NA SUSP: 1.0000 | Freq: Two times a day (BID) | NASAL | 5 refills | Status: DC

## 2020-01-11 NOTE — Telephone Encounter (Signed)
Dr. Selena Batten would like this patient referred to ENT with Dr. Ezzard Standing for chronic cough to rule out GERD and VCD. Thank you

## 2020-01-11 NOTE — Assessment & Plan Note (Addendum)
Rhinitis symptoms with PND for many years with worsening in the spring and fall. Using Singulair, zyrtec and Flonase as needed with some benefit.   Today's skin testing was positive to weed and perennial mold mix. Negative to common foods.   I don't believe these are triggering her current symptoms as weeds pollinate in the fall.  Start environmental control measures.  May use over the counter antihistamines such as Zyrtec (cetirizine), Claritin (loratadine), Allegra (fexofenadine), or Xyzal (levocetirizine) daily as needed.  Start dymista (fluticasone + azelastine nasal spray combination) 1 spray per nostril twice a day for the PND.   This replaces your other nasal spray. If it's not covered let us know.

## 2020-01-11 NOTE — Patient Instructions (Addendum)
Today's skin testing was positive to weed and perennial mold mix. Results given.    Will refer to ENT  Asthma: . Daily controller medication(s): continue Breztri 160 2 puffs twice a day with spacer and rinse mouth afterwards. Marland Kitchen Spacer given and demonstrated proper use with inhaler. Patient understood technique and all questions/concerned were addressed.  . Continue Singulair (montelukast) 10mg  daily. . May use levoalbuterol rescue inhaler 2 puffs or nebulizer every 4 to 6 hours as needed for shortness of breath, chest tightness, coughing, and wheezing. May use levoalbuterol rescue inhaler 2 puffs 5 to 15 minutes prior to strenuous physical activities. Monitor frequency of use.  . Asthma control goals:  o Full participation in all desired activities (may need albuterol before activity) o Albuterol use two times or less a week on average (not counting use with activity) o Cough interfering with sleep two times or less a month o Oral steroids no more than once a year o No hospitalizations  Environmental allergies:  Start environmental control measures.  May use over the counter antihistamines such as Zyrtec (cetirizine), Claritin (loratadine), Allegra (fexofenadine), or Xyzal (levocetirizine) daily as needed.  Start dymista (fluticasone + azelastine nasal spray combination) 1 spray per nostril twice a day.  This replaces your other nasal spray. If it's not covered let know.   Follow up in 2 months or sooner if needed.   Reducing Pollen Exposure . Pollen seasons: trees (spring), grass (summer) and ragweed/weeds (fall). 02-04-1982 Keep windows closed in your home and car to lower pollen exposure.  Marland Kitchen air conditioning in the bedroom and throughout the house if possible.  . Avoid going out in dry windy days - especially early morning. . Pollen counts are highest between 5 - 10 AM and on dry, hot and windy days.  . Save outside activities for late afternoon or after a heavy rain, when  pollen levels are lower.  . Avoid mowing of grass if you have grass pollen allergy. Lilian Kapur Be aware that pollen can also be transported indoors on people and pets.  . Dry your clothes in an automatic dryer rather than hanging them outside where they might collect pollen.  . Rinse hair and eyes before bedtime.  Mold Control . Mold and fungi can grow on a variety of surfaces provided certain temperature and moisture conditions exist.  . Outdoor molds grow on plants, decaying vegetation and soil. The major outdoor mold, Alternaria and Cladosporium, are found in very high numbers during hot and dry conditions. Generally, a late summer - fall peak is seen for common outdoor fungal spores. Rain will temporarily lower outdoor mold spore count, but counts rise rapidly when the rainy period ends. . The most important indoor molds are Aspergillus and Penicillium. Dark, humid and poorly ventilated basements are ideal sites for mold growth. The next most common sites of mold growth are the bathroom and the kitchen. Outdoor (Seasonal) Mold Control . Use air conditioning and keep windows closed. . Avoid exposure to decaying vegetation. [birth year]1976 Avoid leaf raking. . Avoid grain handling. . Consider wearing a face mask if working in moldy areas.  Indoor (Perennial) Mold Control  . Maintain humidity below 50%. . Get rid of mold growth on hard surfaces with water, detergent and, if necessary, 5% bleach (do not mix with other cleaners). Then dry the area completely. If mold covers an area more than 10 square feet, consider hiring an indoor environmental professional. . For clothing, washing with soap and water is best.  If moldy items cannot be cleaned and dried, throw them away. . Remove sources e.g. contaminated carpets. . Repair and seal leaking roofs or pipes. Using dehumidifiers in damp basements may be helpful, but empty the water and clean units regularly to prevent mildew from forming. All rooms, especially basements,  bathrooms and kitchens, require ventilation and cleaning to deter mold and mildew growth. Avoid carpeting on concrete or damp floors, and storing items in damp areas.

## 2020-01-11 NOTE — Assessment & Plan Note (Addendum)
Diagnosed with asthma over 20+ years ago and was doing well up until the last month with coughing and chest tightness mainly. This episode may have started in the fall after her bronchitis flare and never completely resolved. Currently on Breztri, Singulair and xopenex with unknown benefit. 2 courses of prednisone this year which helped for a few days at most. No history of reflux. Normal CXR on 01/10/20 per patient report. No recent EKG. Up to date with COVID-19 vaccinations and no prior COVID-19 diagnosis.   Today's skin testing was positive to weed and perennial mold mix. Negative to common foods. Results given.   Today's spirometry showed: normal pattern with no improvement in FEV1 post bronchodilator treatment. However, clinically feeling better.   Discussed with patient that I'm not sure how much of her current symptoms are related to asthma versus something else as her inhalers were not as effective.   Recommend getting a baseline EKG. . Daily controller medication(s): continue Breztri 160 2 puffs twice a day with spacer and rinse mouth afterwards. Marland Kitchen Spacer given and demonstrated proper use with inhaler. Patient understood technique and all questions/concerned were addressed.  . Continue Singulair (montelukast) 10mg  daily. . May use levoalbuterol rescue inhaler 2 puffs or nebulizer every 4 to 6 hours as needed for shortness of breath, chest tightness, coughing, and wheezing. May use levoalbuterol rescue inhaler 2 puffs 5 to 15 minutes prior to strenuous physical activities. Monitor frequency of use.  . Repeat spirometry at next visit.

## 2020-01-11 NOTE — Assessment & Plan Note (Signed)
   Refer to ENT for the chronic coughing to rule out GERD and VCD.

## 2020-01-11 NOTE — Progress Notes (Signed)
New Patient Note  RE: Colleen House MRN: [id number] DOB: [birth year] Date of Office Visit: 01/11/2020  Referring provider: PCP Primary care provider: Gweneth Dimitri, MD  Chief Complaint: Shortness of Breath (Having a hard time breathing) and Asthma  History of Present Illness: I had the pleasure of seeing Colleen House for initial evaluation at the [facility] on 01/11/2020. She is a 54 y.o. female, who is referred here by Gweneth Dimitri, MD for the evaluation of asthma.  Asthma: She reports symptoms of chest tightness, shortness of breath, dry coughing, wheezing, nocturnal awakenings for 20+ years but worsening the past 1 month - sighing, coughing after talking. Current medications include Breztri 160 2 puffs twice a day x few weeks, Singulair and xopenex prn with unknown benefit. She reports not using aerochamber with inhalers. She tried the following inhalers: Symbicort. Main triggers are unknown. In the last month, frequency of symptoms: daily. Frequency of nocturnal symptoms: every night. Frequency of SABA use: few times/week. Interference with physical activity: yes. Sleep is disturbed. In the last 12 months, emergency room visits/urgent care visits/doctor office visits or hospitalizations due to respiratory issues: multiple times. In the last 12 months, oral steroids courses: 2 with minimal benefit. Lifetime history of hospitalization for respiratory issues: no. Prior intubations: no. History of pneumonia: in 2015. She was evaluated by allergist in the past. Smoking exposure: denies. Up to date with flu vaccine: yes.  History of reflux: no, had ph monitoring done about 17 years ago. Patient is up to date with COVID-19 vaccinations.  CXR on 01/10/2020 unremarkable.  No recent EKG.   Rhinitis: She reports symptoms of PND, scratchy throat, coughing. Symptoms have been going on for many years. The symptoms are present during the spring and fall. Other triggers include  exposure to unknown. Anosmia: no. Headache: sometimes. She has used Singulair, zyrtec, and Flonase with some improvement in symptoms. Sinus infections: no. Previous work up includes: skin testing over 20 years showed multiple positives per patient report. Previous ENT evaluation: yes to rule out GERD about 17 years ago.  Previous sinus imaging: no. History of nasal polyps: no.  Patient usually gets bronchitis twice a day. She had bronchitis in the fall and was treated with steroids which helped. In December started on Singulair and Symbicort.   Assessment and Plan: Colleen House is a 54 y.o. female with: Moderate persistent asthma without complication Diagnosed with asthma over 20+ years ago and was doing well up until the last month with coughing and chest tightness mainly. This episode may have started in the fall after her bronchitis flare and never completely resolved. Currently on Breztri, Singulair and xopenex with unknown benefit. 2 courses of prednisone this year which helped for a few days at most. No history of reflux. Normal CXR on 01/10/20 per patient report. No recent EKG. Up to date with COVID-19 vaccinations and no prior COVID-19 diagnosis.   Today's skin testing was positive to weed and perennial mold mix. Negative to common foods. Results given.   Today's spirometry showed: normal pattern with no improvement in FEV1 post bronchodilator treatment. However, clinically feeling better.   Discussed with patient that I'm not sure how much of her current symptoms are related to asthma versus something else as her inhalers were not as effective.   Recommend getting a baseline EKG. . Daily controller medication(s): continue Breztri 160 2 puffs twice a day with spacer and rinse mouth afterwards. Marland Kitchen Spacer given and demonstrated proper use with inhaler. Patient  understood technique and all questions/concerned were addressed.  . Continue Singulair (montelukast) 10mg  daily. . May use levoalbuterol  rescue inhaler 2 puffs or nebulizer every 4 to 6 hours as needed for shortness of breath, chest tightness, coughing, and wheezing. May use levoalbuterol rescue inhaler 2 puffs 5 to 15 minutes prior to strenuous physical activities. Monitor frequency of use.  . Repeat spirometry at next visit.   Chronic coughing  Refer to ENT for the chronic coughing to rule out GERD and VCD.  Other allergic rhinitis Rhinitis symptoms with PND for many years with worsening in the spring and fall. Using Singulair, zyrtec and Flonase as needed with some benefit.   Today's skin testing was positive to weed and perennial mold mix. Negative to common foods.   I don't believe these are triggering her current symptoms as weeds pollinate in the fall.  Start environmental control measures.  May use over the counter antihistamines such as Zyrtec (cetirizine), Claritin (loratadine), Allegra (fexofenadine), or Xyzal (levocetirizine) daily as needed.  Start dymista (fluticasone + azelastine nasal spray combination) 1 spray per nostril twice a day for the PND.   This replaces your other nasal spray. If it's not covered let us know.   Return in about 2 months (around 03/12/2020).  Meds ordered this encounter  Medications  . Azelastine-Fluticasone 137-50 MCG/ACT SUSP    Sig: Place 1 spray into the nose in the morning and at bedtime.    Dispense:  23 g    Refill:  5   Other allergy screening: Food allergy: no Medication allergy: yes  Erythromycin - chest pain? Venlafaxine - difficult to taper Hymenoptera allergy: no Urticaria: no Eczema:no History of recurrent infections suggestive of immunodeficency: no  Diagnostics: Spirometry:  Tracings reviewed. Her effort: Good reproducible efforts. FVC: 3.48L FEV1: 2.48L, 99% predicted FEV1/FVC ratio: 71% Interpretation: Spirometry consistent with normal pattern with no improvement in FEV1 post bronchodilator treatment. Clinically feeling better.  Please see  scanned spirometry results for details.  Skin Testing: Environmental allergy panel. positive to weed and perennial mold mix. Negative to common foods.  Results discussed with patient/family. Airborne Adult Perc - 01/11/20 0953    Time Antigen Placed  0954    Allergen Manufacturer  Lavella Hammock    Location  Back    Number of Test  59    Panel 1  Select    1. Control-Buffer 50% Glycerol  Negative    2. Control-Histamine 1 mg/ml  2+    3. Albumin saline  Negative    4. Grandview  Negative    5. [place]  Negative    6. Johnson  Negative    7. And[place] Blue  Negative    8. Meadow Fescue  Negative    9. Perennial Rye  Negative    10. Sweet Vernal  Negative    11. Timothy  Negative    12. Cocklebur  Negative    13. Burweed Marshelder  Negative    14. Ragweed, short  Negative    15. Ragweed, Giant  Negative    16. Plantain,  English  Negative    17. Lamb's Quarters  Negative    18. Sheep Sorrell  Negative    19. Rough Pigweed  Negative    20. Marsh Elder, Rough  Negative    21. Mugwort, Common  Negative    22. Ash mix  Negative    23. Birch mix  Negative    24. Beech American  Negative    25. Box,  Elder  Negative    26. Cedar, red  Negative    27. Cottonwood, Guinea-BissauEastern  Negative    28. Elm mix  Negative    29. Hickory mix  Negative    30. Maple mix  Negative    31. Oak, Guinea-Bissau[place]astern mix  Negative    32. Pecan Pollen  Negative    33. Pine mix  Negative    34. Sycamore Eastern  Negative    35. Walnut, Black Pollen  Negative    36. Alternaria alternata  Negative    37. Cladosporium Herbarum  Negative    38. Aspergillus mix  Negative    39. Penicillium mix  Negative    40. Bipolaris sorokiniana (Helminthosporium)  Negative    41. Drechslera spicifera (Curvularia)  Negative    42. Mucor plumbeus  Negative    43. Fusarium moniliforme  Negative    44. Aureobasidium pullulans (pullulara)  Negative    45. Rhizopus oryzae  Negative    46. Botrytis cinera  Negative    47. Epicoccum nigrum   Negative    48. Phoma betae  Negative    49. Candida Albicans  Negative    50. Trichophyton mentagrophytes  Negative    51. Mite, D Farinae  5,000 AU/ml  Negative    52. Mite, D Pteronyssinus  5,000 AU/ml  Negative    53. Cat Hair 10,000 BAU/ml  Negative    54.  Dog Epithelia  Negative    55. Mixed Feathers  Negative    56. Horse Epithelia  Negative    57. Cockroach, German  Negative    58. Mouse  Negative    59. Tobacco Leaf  Negative     Intradermal - 01/11/20 1022    Time Antigen Placed  1022    Allergen Manufacturer  Greer    Location  Arm    Number of Test  15    Intradermal  Select    Control  Negative    [place] Southern Territ[place]  Negative    Johnson  Negative    7 Grass  Negative    Ragweed mix  Negative    Weed mix  2+    Tree mix  Negative    Mold 1  Negative    Mold 2  Negative    Mold 3  Negative    Mold 4  2+    Cat  Negative    Dog  Negative    Cockroach  Negative    Mite mix  Negative     Food Adult Perc - 01/11/20 0900    Time Antigen Placed  16100954    Allergen Manufacturer  Waynette ButteryGreer    Location  Back    Number of allergen test  10    1. Peanut  Negative    2. Soybean  Negative    3. Wheat  Negative    4. Sesame  Negative    5. Milk, cow  Negative    6. Egg White, Chicken  Negative    7. Casein  Negative    8. Shellfish Mix  Negative    9. Fish Mix  Negative    10. Cashew  Negative       Past Medical History: Patient Active Problem List   Diagnosis Date Noted  . Moderate persistent asthma without complication 01/11/2020  . Chronic coughing 01/11/2020  . Other allergic rhinitis 01/11/2020   Past Medical History:  Diagnosis Date  . Anxiety   . Asthma  very mild-smoke triggers it  . Depression   . Headache(784.0)   . Perimenopausal   . Recurrent upper respiratory infection (URI)    Past Surgical History: Past Surgical History:  Procedure Laterality Date  . CARPAL TUNNEL RELEASE  2009   right=[facility]  . TRIGGER FINGER RELEASE Right 07/17/2013    Procedure: RELEASE A-1 PULLEY  RIGHT THUMB;  Surgeon: Nicki Reaper, MD;  Location: [facility];  Service: Orthopedics;  Laterality: Right;   Medication List:  Current Outpatient Medications  Medication Sig Dispense Refill  . albuterol (PROVENTIL HFA;VENTOLIN HFA) 108 (90 BASE) MCG/ACT inhaler Inhale 2 puffs into the lungs every 6 (six) hours as needed for wheezing.    . calcium carbonate (OSCAL) 1500 (600 Ca) MG TABS tablet Take by mouth.    . eletriptan (RELPAX) 20 MG tablet Take 20 mg by mouth as needed for migraine. One tablet by mouth at onset of headache. May repeat in 2 hours if headache persists or recurs.    Creig Hines MAINTENANCE PACK 4 MCG INST USE 1  INSERT TWO TIMES A WEEK VAGINAL 90 DAYS    . LamoTRIgine 250 MG TB24 24 hour tablet Take 1 tablet by mouth daily.    . montelukast (SINGULAIR) 10 MG tablet Take 10 mg by mouth daily.    . predniSONE (STERAPRED UNI-PAK 48 TAB) 10 MG (48) TBPK tablet AS DIRECTED ONCE A DAY ORALLY    . Azelastine-Fluticasone 137-50 MCG/ACT SUSP Place 1 spray into the nose in the morning and at bedtime. 23 g 5  . escitalopram (LEXAPRO) 20 MG tablet Take 20 mg by mouth every evening.    Marland Kitchen HYDROcodone-acetaminophen (NORCO) 5-325 MG per tablet Take 1 tablet by mouth every 6 (six) hours as needed for pain. 30 tablet 0  . lamoTRIgine (LAMICTAL) 200 MG tablet Take 200 mg by mouth every evening.    . mometasone (NASONEX) 50 MCG/ACT nasal spray Place 2 sprays into the nose as needed.     No current facility-administered medications for this visit.   Allergies: Allergies  Allergen Reactions  . Erythromycin     Made her chest hurt  . Venlafaxine Other (See Comments)    "Hard to taper" per patient report   Social History: Social History   Socioeconomic History  . Marital status: Married    Spouse name: Not on file  . Number of children: Not on file  . Years of education: Not on file  . Highest education level: Not on file  Occupational  History  . Not on file  Tobacco Use  . Smoking status: Never Smoker  . Smokeless tobacco: Never Used  Substance and Sexual Activity  . Alcohol use: Yes    Comment: occ  . Drug use: No  . Sexual activity: Not on file  Other Topics Concern  . Not on file  Social History Narrative  . Not on file   Social Determinants of Health   Financial Resource Strain:   . Difficulty of Paying Living Expenses:   Food Insecurity:   . Worried About Programme researcher, broadcasting/film/video in the Last Year:   . Barista in the Last Year:   Transportation Needs:   . Freight forwarder (Medical):   Marland Kitchen Lack of Transportation (Non-Medical):   Physical Activity:   . Days of Exercise per Week:   . Minutes of Exercise per Session:   Stress:   . Feeling of Stress :   Social Connections:   .  Frequency of Communication with Friends and Family:   . Frequency of Social Gatherings with Friends and Family:   . Attends Religious Services:   . Active Member of Clubs or Organizations:   . Attends Banker Meetings:   Marland Kitchen Marital Status:    Lives in a 55 year old home. Smoking: denies Occupation: Geophysical data processor History: Water Damage/mildew in the house: yes Carpet in the family room: yes Carpet in the bedroom: yes Heating: gas Cooling: central Pet: yes 1 dog x 9.5 years  Family History: Family History  Problem Relation Age of Onset  . Breast cancer Maternal Aunt 24  . Allergic rhinitis Father   . Asthma Neg Hx   . Eczema Neg Hx   . Urticaria Neg Hx    Review of Systems  Constitutional: Negative for appetite change, chills, fever and unexpected weight change.  HENT: Positive for postnasal drip. Negative for congestion and rhinorrhea.   Eyes: Negative for itching.  Respiratory: Positive for cough, chest tightness and shortness of breath. Negative for wheezing.   Cardiovascular: Negative for chest pain.  Gastrointestinal: Negative for abdominal pain.    Genitourinary: Negative for difficulty urinating.  Skin: Negative for rash.  Allergic/Immunologic: Positive for environmental allergies. Negative for food allergies.  Neurological: Negative for headaches.   Objective: BP (!) 118/58 (BP Location: Right Arm, Patient Position: Sitting, Cuff Size: Normal)   Pulse 67   Temp (!) 97.3 F (36.3 C) (Temporal)   Resp 16   Ht 5' 2.9" (1.598 m)   Wt 142 lb (64.4 kg)   LMP 05/15/2013   SpO2 98%   BMI 25.23 kg/m  Body mass index is 25.23 kg/m. Physical Exam  Constitutional: She is oriented to person, place, and time. She appears well-developed and well-nourished.  HENT:  Head: Normocephalic and atraumatic.  Right Ear: External ear normal.  Left Ear: External ear normal.  Nose: Nose normal.  Mouth/Throat: Oropharynx is clear and moist.  Eyes: Conjunctivae and EOM are normal.  Cardiovascular: Normal rate, regular rhythm and normal heart sounds. Exam reveals no gallop and no friction rub.  No murmur heard. Pulmonary/Chest: Effort normal and breath sounds normal. She has no wheezes. She has no rales.  Abdominal: Soft.  Musculoskeletal:     Cervical back: Neck supple.  Neurological: She is alert and oriented to person, place, and time.  Skin: Skin is warm. No rash noted.  Psychiatric: She has a normal mood and affect. Her behavior is normal.  Nursing note and vitals reviewed.  The plan was reviewed with the patient/family, and all questions/concerned were addressed.  It was my pleasure to see Colleen House today and participate in her care. Please feel free to contact me with any questions or concerns.  Sincerely,  Wyline Mood, DO Allergy & Immunology  [facility] office: [phone] [facility] office: [phone] [place] office: [phone]

## 2020-01-12 NOTE — Telephone Encounter (Signed)
Referral has been placed to Dr Ezzard Standing for scheduling.  I called and left a voicemail for the patient with this information.   Thanks

## 2020-01-18 ENCOUNTER — Other Ambulatory Visit: Payer: Self-pay

## 2020-01-18 ENCOUNTER — Ambulatory Visit (INDEPENDENT_AMBULATORY_CARE_PROVIDER_SITE_OTHER): Payer: BC Managed Care – PPO | Admitting: Otolaryngology

## 2020-01-18 ENCOUNTER — Encounter (INDEPENDENT_AMBULATORY_CARE_PROVIDER_SITE_OTHER): Payer: Self-pay | Admitting: Otolaryngology

## 2020-01-18 VITALS — Temp 97.9°F

## 2020-01-18 DIAGNOSIS — Z87898 Personal history of other specified conditions: Secondary | ICD-10-CM | POA: Diagnosis not present

## 2020-01-18 NOTE — Progress Notes (Signed)
HPI: Colleen House is a 54 y.o. female who presents is referred by Dr. Selena Batten for evaluation of upper airway.  Patient has a long history of asthma and uses inhalers.  She has episodes where she has difficulty breathing and trouble projecting her voice.  She has been on several inhalers. She has had previous evaluation for GE reflux disease several years ago by Dr. Jearld Fenton And this was negative. She has had a intermittent cough at night although this is doing better presently. She was just recently started on Astelin nasal spray and also seems to be doing better with the spray. 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  Past Medical History:  Diagnosis Date  . Anxiety   . Asthma    very mild-smoke triggers it  . Depression   . Headache(784.0)   . Perimenopausal   . Recurrent upper respiratory infection (URI)    Past Surgical History:  Procedure Laterality Date  . CARPAL TUNNEL RELEASE  2009   right=[facility]  . TRIGGER FINGER RELEASE Right 07/17/2013   Procedure: RELEASE A-1 PULLEY  RIGHT THUMB;  Surgeon: Nicki Reaper, MD;  [facility];  Service: Orthopedics;  Laterality: Right;   Social History   Socioeconomic History  . Marital status: Married    Spouse name: Not on file  . Number of children: Not on file  . Years of education: Not on file  . Highest education level: Not on file  Occupational History  . Not on file  Tobacco Use  . Smoking status: Never Smoker  . Smokeless tobacco: Never Used  Substance and Sexual Activity  . Alcohol use: Yes    Comment: occ  . Drug use: No  . Sexual activity: Not on file  Other Topics Concern  . Not on file  Social History Narrative  . Not on file   Social Determinants of Health   Financial Resource Strain:   . Difficulty of Paying Living Expenses:   Food Insecurity:   . Worried About Programme researcher, broadcasting/film/video in the Last Year:   . Barista in the Last Year:   Transportation Needs:   . Automotive engineer (Medical):   Marland Kitchen Lack of Transportation (Non-Medical):   Physical Activity:   . Days of Exercise per Week:   . Minutes of Exercise per Session:   Stress:   . Feeling of Stress :   Social Connections:   . Frequency of Communication with Friends and Family:   . Frequency of Social Gatherings with Friends and Family:   . Attends Religious Services:   . Active Member of Clubs or Organizations:   . Attends Banker Meetings:   Marland Kitchen Marital Status:    Family History  Problem Relation Age of Onset  . Breast cancer Maternal Aunt 90  . Allergic rhinitis Father   . Asthma Neg Hx   . Eczema Neg Hx   . Urticaria Neg Hx    Allergies  Allergen Reactions  . Erythromycin     Made her chest hurt  . Venlafaxine Other (See Comments)    "Hard to taper" per patient report   Prior to Admission medications   Medication Sig Start Date End Date Taking? Authorizing Provider  albuterol (PROVENTIL HFA;VENTOLIN HFA) 108 (90 BASE) MCG/ACT inhaler Inhale 2 puffs into the lungs every 6 (six) hours as needed for wheezing.   Yes [provider]  Azelastine-Fluticasone 137-50 MCG/ACT SUSP Place 1 spray into the  nose in the morning and at bedtime. 01/11/20  Yes Garnet Sierras, DO  calcium carbonate (OSCAL) 1500 (600 Ca) MG TABS tablet Take by mouth.   Yes [provider]  eletriptan (RELPAX) 20 MG tablet Take 20 mg by mouth as needed for migraine. One tablet by mouth at onset of headache. May repeat in 2 hours if headache persists or recurs.   Yes [provider]  IMVEXXY MAINTENANCE PACK 4 MCG INST USE 1  INSERT TWO TIMES A WEEK VAGINAL 90 DAYS 10/03/19  Yes [provider]  LamoTRIgine 250 MG TB24 24 hour tablet Take 1 tablet by mouth daily. 12/01/19  Yes [provider]  montelukast (SINGULAIR) 10 MG tablet Take 10 mg by mouth daily. 10/26/19  Yes [provider]  escitalopram (LEXAPRO) 20 MG tablet Take 20 mg by mouth every evening.     [provider]  HYDROcodone-acetaminophen (NORCO) 5-325 MG per tablet Take 1 tablet by mouth every 6 (six) hours as needed for pain. 07/17/13   Daryll Brod, MD  lamoTRIgine (LAMICTAL) 200 MG tablet Take 200 mg by mouth every evening.    [provider]  mometasone (NASONEX) 50 MCG/ACT nasal spray Place 2 sprays into the nose as needed.    [provider]  predniSONE (STERAPRED UNI-PAK 48 TAB) 10 MG (48) TBPK tablet AS DIRECTED ONCE A DAY ORALLY 01/02/20   [provider]     Positive ROS: Otherwise negative  All other systems have been reviewed and were otherwise negative with the exception of those mentioned in the HPI and as above.  Physical Exam: Constitutional: Alert, well-appearing, no acute distress.  Patient is having no airway problems in the office today.  Her voice is normal. Ears: External ears without lesions or tenderness. Ear canals are clear bilaterally with intact, clear TMs.  On hearing screening with a 512 1024 tuning fork she seems to hear well in both ears with no substantial hearing loss. Nasal: External nose without lesions. Septum mildly deviated to the left with a mild rhinitis.  No signs of infection.. Clear nasal passages with only clear mucus discharge. Fiberoptic laryngoscopy was performed through the right nostril and on fiberoptic laryngoscopy the nasopharynx was clear.  Base of tongue vallecula epiglottis were normal.  Vocal cords were clear bilaterally with normal vocal cord mobility.  Subglottis was clear with no subglottic abnormality noted.  Piriform sinuses were clear bilaterally. Oral: Lips and gums without lesions. Tongue and palate mucosa without lesions. Posterior oropharynx clear. Neck: No palpable adenopathy or masses Respiratory: Breathing comfortably  Skin: No facial/neck lesions or rash noted.  Laryngoscopy  Date/Time: 01/18/2020 4:57 PM Performed by: Rozetta Nunnery, MD Authorized by: Rozetta Nunnery, MD   Consent:    Consent obtained:  Verbal   Consent given by:  Patient Procedure details:    Indications: direct visualization of the upper aerodigestive tract     Medication:  Afrin   Scope location: right nare   Sinus:    Right nasopharynx: normal   Mouth:    Oropharynx: normal     Vallecula: normal     Base of tongue: normal     Epiglottis: normal   Throat:    False vocal cords: normal     True vocal cords: normal   Comments:     On fiberoptic endoscopy vocal cords glottis and subglottis were clear.  No mucosal abnormalities noted.    Assessment: History of episodic shortness of breath. She has  no shortness of breath in the office today with normal upper airway and vocal cord examination.  Plan: Reassured patient of normal upper airway examination.   Narda Bonds, MD   CC:

## 2020-01-26 ENCOUNTER — Ambulatory Visit: Payer: Self-pay | Admitting: Allergy

## 2020-03-11 NOTE — Progress Notes (Signed)
Follow Up Note  RE: Colleen House MRN: [id number] DOB: April 27, 1966 Date of Office Visit: 03/12/2020  Referring provider: Cari Caraway, MD Primary care provider: Cari Caraway, MD  Chief Complaint: Asthma  History of Present Illness: I had the pleasure of seeing Colleen House for a follow up visit at the [facility] on 03/12/2020. She is a 54 y.o. female, who is being followed for asthma, chronic coughing and allergic rhinitis. Her previous allergy office visit was on 01/11/2020 with Dr. Maudie Mercury. Today is a regular follow up visit.  Moderate persistent asthma/coughing EKG was normal and ENT evaluation was normal.  Stopped Breztri due to thrush in April and noticed no worsening symptoms for the past 2 months.  No rescue inhaler use either.   Still taking Singulair daily at night.   Patient states that she had anxiety when she was in school. Patient is a Publishing rights manager. She has been seeing a therapist which has been helping. Reluctant in taking any oral medications. 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 There is also some mold in the building.   Other allergic rhinitis  Not taking any allergy medications or nasal sprays currently. She does have some clear drainage.   Assessment and Plan: Colleen House is a 54 y.o. female with: Moderate persistent asthma without complication Past history - Diagnosed with asthma over 20+ years ago and was doing well up until the last month with coughing and chest tightness mainly. This episode may have started in the fall after her bronchitis flare and never completely resolved. Currently on Breztri, Singulair and xopenex with unknown benefit. 2 courses of prednisone this year which helped for a few days at most. No history of reflux. Normal CXR on 01/10/20 per patient report. Up to date with COVID-19 vaccinations and no prior COVID-19 diagnosis. 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  Negative to common foods. Spirometry showed: normal pattern with no improvement in FEV1 post bronchodilator treatment.  Interim history - Stopped Breztri about 2 months ago due to thrush with no worsening symptoms. Normal EKG and ENT evaluation unremarkable.  Today's spirometry showed some mild obstruction - slightly worse than previous one. . Discussed with patient that I'm okay with stopping Breztri for now as she is not having any clinical symptoms.  . 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  . Daily controller medication(s):  Marland Kitchen Continue Singulair (montelukast) 10mg  daily. . May use levoalbuterol rescue inhaler 2 puffs every 4 to 6 hours as needed for shortness of breath, chest tightness, coughing, and wheezing. May use levoalbuterol rescue inhaler 2 puffs 5 to 15 minutes prior to strenuous physical activities. Monitor frequency of use.  . Repeat spirometry at next visit.   Other allergic rhinitis Past history - Rhinitis symptoms with PND for many years with worsening in the spring and fall. Using Singulair, zyrtec and Flonase as needed with some benefit. 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 Negative to common foods. Interim history - Does have some PND but not taking anything currently.   Continue environmental control measures.  May use over the counter antihistamines such as Zyrtec (cetirizine), Claritin (loratadine), Allegra (fexofenadine), or Xyzal (levocetirizine) daily as needed.  May use dymista (fluticasone + azelastine nasal spray  combination) 1 spray per nostril 1-2 times a day as needed.   Return in about 3 months (around 06/12/2020).  Diagnostics: Spirometry:  Tracings reviewed. Her effort: Good reproducible efforts. FVC: 3.48L FEV1: 2.33L, 95% predicted FEV1/FVC  ratio: 67% Interpretation: Spirometry consistent with mild obstructive disease - slightly worse than previous one.  Please see scanned spirometry results for details.  Medication List:  Current Outpatient Medications  Medication Sig Dispense Refill  . calcium carbonate (OSCAL) 1500 (600 Ca) MG TABS tablet Take by mouth.    . eletriptan (RELPAX) 20 MG tablet Take 20 mg by mouth as needed for migraine. One tablet by mouth at onset of headache. May repeat in 2 hours if headache persists or recurs.    Creig Hines MAINTENANCE PACK 4 MCG INST USE 1  INSERT TWO TIMES A WEEK VAGINAL 90 DAYS    . LamoTRIgine 250 MG TB24 24 hour tablet Take 1 tablet by mouth daily.    . montelukast (SINGULAIR) 10 MG tablet Take 10 mg by mouth daily.    Marland Kitchen nystatin (MYCOSTATIN) 100000 UNIT/ML suspension     . albuterol (PROVENTIL HFA;VENTOLIN HFA) 108 (90 BASE) MCG/ACT inhaler Inhale 2 puffs into the lungs every 6 (six) hours as needed for wheezing. (Patient not taking: Reported on 03/12/2020)    . Azelastine-Fluticasone 137-50 MCG/ACT SUSP Place 1 spray into the nose in the morning and at bedtime. (Patient not taking: Reported on 03/12/2020) 23 g 5  . escitalopram (LEXAPRO) 20 MG tablet Take 20 mg by mouth every evening.    Marland Kitchen HYDROcodone-acetaminophen (NORCO) 5-325 MG per tablet Take 1 tablet by mouth every 6 (six) hours as needed for pain. 30 tablet 0  . lamoTRIgine (LAMICTAL) 200 MG tablet Take 200 mg by mouth every evening.     No current facility-administered medications for this visit.   Allergies: Allergies  Allergen Reactions  . Erythromycin     Made her chest hurt  . Venlafaxine Other (See Comments)    "Hard to taper" per patient report   I reviewed her past medical history, social history, family history, and environmental history and no significant changes have been reported from her previous visit.  Review of Systems  Constitutional: Negative for appetite change, chills, fever and unexpected weight  change.  HENT: Positive for rhinorrhea. Negative for congestion and postnasal drip.   Eyes: Negative for itching.  Respiratory: Positive for shortness of breath. Negative for cough, chest tightness and wheezing.   Cardiovascular: Negative for chest pain.  Gastrointestinal: Negative for abdominal pain.  Genitourinary: Negative for difficulty urinating.  Skin: Negative for rash.  Allergic/Immunologic: Positive for environmental allergies. Negative for food allergies.  Neurological: Negative for headaches.   Objective: BP 112/72 (BP Location: Left Arm, Patient Position: Sitting, Cuff Size: Normal)   Pulse 67   Resp 17   LMP 05/15/2013   SpO2 97%  There is no height or weight on file to calculate BMI. Physical Exam Vitals and nursing note reviewed.  Constitutional:      Appearance: Normal appearance. She is well-developed.  HENT:     Head: Normocephalic and atraumatic.     Right Ear: Tympanic membrane and external ear normal.     Left Ear: Tympanic membrane and external ear normal.     Nose: Nose normal.  Eyes:     Conjunctiva/sclera: Conjunctivae normal.  Cardiovascular:     Rate and Rhythm: Normal rate and regular rhythm.     Heart sounds: Normal heart sounds. No murmur heard.  No friction rub. No gallop.   Pulmonary:     Effort: Pulmonary effort is normal.     Breath sounds: Normal breath sounds. No wheezing or rales.  Abdominal:     Palpations: Abdomen is soft.  Musculoskeletal:     Cervical back: Neck supple.  Skin:    General: Skin is warm.     Findings: No rash.  Neurological:     Mental Status: She is alert and oriented to person, place, and time.  Psychiatric:        Behavior: Behavior normal.    Previous notes and tests were reviewed. The plan was reviewed with the patient/family, and all questions/concerned were addressed.  It was my pleasure to see Colleen House today and participate in her care. Please feel free to contact me with any questions or  concerns.  Sincerely,  Wyline Mood, DO Allergy & Immunology  [facility] office: [phone] [facility] office: [phone] [place] office: [phone]

## 2020-03-12 ENCOUNTER — Encounter: Payer: Self-pay | Admitting: Allergy

## 2020-03-12 ENCOUNTER — Ambulatory Visit: Payer: BC Managed Care – PPO | Admitting: Allergy

## 2020-03-12 ENCOUNTER — Other Ambulatory Visit: Payer: Self-pay

## 2020-03-12 VITALS — BP 112/72 | HR 67 | Resp 17

## 2020-03-12 DIAGNOSIS — J3089 Other allergic rhinitis: Secondary | ICD-10-CM | POA: Diagnosis not present

## 2020-03-12 DIAGNOSIS — J4540 Moderate persistent asthma, uncomplicated: Secondary | ICD-10-CM | POA: Diagnosis not present

## 2020-03-12 NOTE — Patient Instructions (Addendum)
Breathing:  . Monitor symptoms.  . Your FEV1 was slightly lower than the last time.  . Daily controller medication(s):  Marland Kitchen Continue Singulair (montelukast) 10mg  daily. . May use levoalbuterol rescue inhaler 2 puffs every 4 to 6 hours as needed for shortness of breath, chest tightness, coughing, and wheezing. May use levoalbuterol rescue inhaler 2 puffs 5 to 15 minutes prior to strenuous physical activities. Monitor frequency of use.  . Breathing control goals:  o Full participation in all desired activities (may need albuterol before activity) o Albuterol use two times or less a week on average (not counting use with activity) o Cough interfering with sleep two times or less a month o Oral steroids no more than once a year o No hospitalizations  Environmental allergies:  Past skin testing was positive to weed and perennial mold mix.   Continue environmental control measures.  May use over the counter antihistamines such as Zyrtec (cetirizine), Claritin (loratadine), Allegra (fexofenadine), or Xyzal (levocetirizine) daily as needed.  May use dymista (fluticasone + azelastine nasal spray combination) 1 spray per nostril 1-2 times a day as needed.   Follow up in 3 months or sooner if needed.   Reducing Pollen Exposure . Pollen seasons: trees (spring), grass (summer) and ragweed/weeds (fall). Marland Kitchen Keep windows closed in your home and car to lower pollen exposure.  Susa Simmonds air conditioning in the bedroom and throughout the house if possible.  . Avoid going out in dry windy days - especially early morning. . Pollen counts are highest between 5 - 10 AM and on dry, hot and windy days.  . Save outside activities for late afternoon or after a heavy rain, when pollen levels are lower.  . Avoid mowing of grass if you have grass pollen allergy. Marland Kitchen Be aware that pollen can also be transported indoors on people and pets.  . Dry your clothes in an automatic dryer rather than hanging them outside where  they might collect pollen.  . Rinse hair and eyes before bedtime.  Mold Control . Mold and fungi can grow on a variety of surfaces provided certain temperature and moisture conditions exist.  . Outdoor molds grow on plants, decaying vegetation and soil. The major outdoor mold, Alternaria and Cladosporium, are found in very high numbers during hot and dry conditions. Generally, a late summer - fall peak is seen for common outdoor fungal spores. Rain will temporarily lower outdoor mold spore count, but counts rise rapidly when the rainy period ends. . The most important indoor molds are Aspergillus and Penicillium. Dark, humid and poorly ventilated basements are ideal sites for mold growth. The next most common sites of mold growth are the bathroom and the kitchen. Outdoor (Seasonal) Mold Control . Use air conditioning and keep windows closed. . Avoid exposure to decaying vegetation. Marland Kitchen Avoid leaf raking. . Avoid grain handling. . Consider wearing a face mask if working in moldy areas.  Indoor (Perennial) Mold Control  . Maintain humidity below 50%. . Get rid of mold growth on hard surfaces with water, detergent and, if necessary, 5% bleach (do not mix with other cleaners). Then dry the area completely. If mold covers an area more than 10 square feet, consider hiring an indoor environmental professional. . For clothing, washing with soap and water is best. If moldy items cannot be cleaned and dried, throw them away. . Remove sources e.g. contaminated carpets. . Repair and seal leaking roofs or pipes. Using dehumidifiers in damp basements may be helpful, but empty  the water and clean units regularly to prevent mildew from forming. All rooms, especially basements, bathrooms and kitchens, require ventilation and cleaning to deter mold and mildew growth. Avoid carpeting on concrete or damp floors, and storing items in damp areas.

## 2020-03-12 NOTE — Assessment & Plan Note (Signed)
Past history - Rhinitis symptoms with PND for many years with worsening in the spring and fall. Using Singulair, zyrtec and Flonase as needed with some benefit. 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 Negative to common foods. Interim history - Does have some PND but not taking anything currently.   Continue environmental control measures.  May use over the counter antihistamines such as Zyrtec (cetirizine), Claritin (loratadine), Allegra (fexofenadine), or Xyzal (levocetirizine) daily as needed.  May use dymista (fluticasone + azelastine nasal spray combination) 1 spray per nostril 1-2 times a day as needed.

## 2020-03-12 NOTE — Assessment & Plan Note (Signed)
Past history - Diagnosed with asthma over 20+ years ago and was doing well up until the last month with coughing and chest tightness mainly. This episode may have started in the fall after her bronchitis flare and never completely resolved. Currently on Breztri, Singulair and xopenex with unknown benefit. 2 courses of prednisone this year which helped for a few days at most. No history of reflux. Normal CXR on 01/10/20 per patient report. Up to date with COVID-19 vaccinations and no prior COVID-19 diagnosis. 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 Negative to common foods. Spirometry showed: normal pattern with no improvement in FEV1 post bronchodilator treatment.  Interim history - Stopped Breztri about 2 months ago due to thrush with no worsening symptoms. Normal EKG and ENT evaluation unremarkable.  Today's spirometry showed some mild obstruction - slightly worse than previous one. . Discussed with patient that I'm okay with stopping Breztri for now as she is not having any clinical symptoms.  . 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  . Daily controller medication(s):  Marland Kitchen Continue Singulair (montelukast) 10mg  daily. . May use levoalbuterol rescue inhaler 2 puffs every 4 to 6 hours as needed for shortness of breath, chest tightness, coughing, and wheezing. May use levoalbuterol rescue inhaler 2 puffs 5 to 15 minutes prior to strenuous physical activities. Monitor frequency of use.  . Repeat spirometry at next visit.

## 2020-04-18 ENCOUNTER — Other Ambulatory Visit: Payer: Self-pay | Admitting: Obstetrics and Gynecology

## 2020-06-12 NOTE — Progress Notes (Deleted)
Follow Up Note  RE: Colleen House MRN: [id number] DOB: 10/31/1965 Date of Office Visit: 06/13/2020  Referring provider: Gweneth Dimitri, MD Primary care provider: Gweneth Dimitri, MD  Chief Complaint: No chief complaint on file.  History of Present Illness: I had the pleasure of seeing Colleen House for a follow up visit at the [facility] on 06/12/2020. She is a 54 y.o. female, who is being followed for asthma, allergic rhinitis. Her previous allergy office visit was on 03/12/2020 with Dr. Selena Batten. Today is a regular follow up visit.  Moderate persistent asthma without complication Past history - Diagnosed with asthma over 20+ years ago and was doing well up until the last month with coughing and chest tightness mainly. This episode may have started in the fall after her bronchitis flare and never completely resolved. Currently on Breztri, Singulair and xopenex with unknown benefit. 2 courses of prednisone this year which helped for a few days at most. No history of reflux. Normal CXR on 01/10/20 per patient report. Up to date with COVID-19 vaccinations and no prior COVID-19 diagnosis. 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 Negative to common foods. Spirometry showed: normal pattern with no improvement in FEV1 post bronchodilator treatment.  Interim history - Stopped Breztri about 2 months ago due to thrush with no worsening symptoms. Normal EKG and ENT evaluation unremarkable.  Today's spirometry showed some mild obstruction - slightly worse than previous one.  Discussed with patient that I'm okay with stopping Breztri for now as she is not having any clinical symptoms.   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   Daily controller medication(s):  Continue Singulair  (montelukast) 10mg  daily.  May use levoalbuterol rescue inhaler 2 puffs every 4 to 6 hours as needed for shortness of breath, chest tightness, coughing, and wheezing. May use levoalbuterol rescue inhaler 2 puffs 5 to 15 minutes prior to strenuous physical activities. Monitor frequency of use.   Repeat spirometry at next visit.   Other allergic rhinitis Past history - Rhinitis symptoms with PND for many years with worsening in the spring and fall. Using Singulair, zyrtec and Flonase as needed with some benefit. 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 Negative to common foods. Interim history - Does have some PND but not taking anything currently.   Continue environmental control measures.  May use over the counter antihistamines such as Zyrtec (cetirizine), Claritin (loratadine), Allegra (fexofenadine), or Xyzal (levocetirizine) daily as needed.  May use dymista (fluticasone + azelastine nasal spray combination) 1 spray per nostril 1-2 times a day as needed.   Assessment and Plan: [name] is a 54 y.o. female with: No problem-specific Assessment & Plan notes found for this encounter.  No follow-ups on file.  No orders of the defined types were placed in this encounter.  Lab Orders  No laboratory test(s) ordered today    Diagnostics: Spirometry:  Tracings reviewed. Her effort: {Blank single:19197::"Good reproducible efforts.","It was hard to get consistent efforts and there is a question as to whether this reflects a maximal maneuver.","Poor effort, data can not be interpreted."} FVC: ***L FEV1: ***L, ***% predicted FEV1/FVC ratio: ***% Interpretation: {Blank single:19197::"Spirometry consistent with mild obstructive disease","Spirometry consistent with moderate obstructive disease","Spirometry consistent with severe obstructive disease","Spirometry consistent with possible restrictive disease","Spirometry  consistent with mixed obstructive and restrictive  disease","Spirometry uninterpretable due to technique","Spirometry consistent with normal pattern","No overt abnormalities noted given today's efforts"}.  Please see scanned spirometry results for details.  Skin Testing: {Blank single:19197::"Select foods","Environmental allergy panel","Environmental allergy panel and select foods","Food allergy panel","None","Deferred due to recent antihistamines use"}. Positive test to: ***. Negative test to: ***.  Results discussed with patient/family.   Medication List:  Current Outpatient Medications  Medication Sig Dispense Refill  . albuterol (PROVENTIL HFA;VENTOLIN HFA) 108 (90 BASE) MCG/ACT inhaler Inhale 2 puffs into the lungs every 6 (six) hours as needed for wheezing. (Patient not taking: Reported on 03/12/2020)    . Azelastine-Fluticasone 137-50 MCG/ACT SUSP Place 1 spray into the nose in the morning and at bedtime. (Patient not taking: Reported on 03/12/2020) 23 g 5  . calcium carbonate (OSCAL) 1500 (600 Ca) MG TABS tablet Take by mouth.    . eletriptan (RELPAX) 20 MG tablet Take 20 mg by mouth as needed for migraine. One tablet by mouth at onset of headache. May repeat in 2 hours if headache persists or recurs.    Marland Kitchen escitalopram (LEXAPRO) 20 MG tablet Take 20 mg by mouth every evening.    Marland Kitchen HYDROcodone-acetaminophen (NORCO) 5-325 MG per tablet Take 1 tablet by mouth every 6 (six) hours as needed for pain. 30 tablet 0  . IMVEXXY MAINTENANCE PACK 4 MCG INST USE 1  INSERT TWO TIMES A WEEK VAGINAL 90 DAYS    . lamoTRIgine (LAMICTAL) 200 MG tablet Take 200 mg by mouth every evening.    . LamoTRIgine 250 MG TB24 24 hour tablet Take 1 tablet by mouth daily.    . montelukast (SINGULAIR) 10 MG tablet Take 10 mg by mouth daily.    Marland Kitchen nystatin (MYCOSTATIN) 100000 UNIT/ML suspension      No current facility-administered medications for this visit.   Allergies: Allergies  Allergen Reactions  . Erythromycin     Made her chest hurt  . Venlafaxine Other  (See Comments)    "Hard to taper" per patient report   I reviewed her past medical history, social history, family history, and environmental history and no significant changes have been reported from her previous visit.  Review of Systems  Constitutional: Negative for appetite change, chills, fever and unexpected weight change.  HENT: Positive for rhinorrhea. Negative for congestion and postnasal drip.   Eyes: Negative for itching.  Respiratory: Positive for shortness of breath. Negative for cough, chest tightness and wheezing.   Cardiovascular: Negative for chest pain.  Gastrointestinal: Negative for abdominal pain.  Genitourinary: Negative for difficulty urinating.  Skin: Negative for rash.  Allergic/Immunologic: Positive for environmental allergies. Negative for food allergies.  Neurological: Negative for headaches.   Objective: LMP 05/15/2013  There is no height or weight on file to calculate BMI. Physical Exam Vitals and nursing note reviewed.  Constitutional:      Appearance: Normal appearance. She is well-developed.  HENT:     Head: Normocephalic and atraumatic.     Right Ear: Tympanic membrane and external ear normal.     Left Ear: Tympanic membrane and external ear normal.     Nose: Nose normal.  Eyes:     Conjunctiva/sclera: Conjunctivae normal.  Cardiovascular:     Rate and Rhythm: Normal rate and regular rhythm.     Heart sounds: Normal heart sounds. No murmur heard.  No friction rub. No gallop.   Pulmonary:     Effort: Pulmonary effort is normal.     Breath sounds: Normal  breath sounds. No wheezing or rales.  Abdominal:     Palpations: Abdomen is soft.  Musculoskeletal:     Cervical back: Neck supple.  Skin:    General: Skin is warm.     Findings: No rash.  Neurological:     Mental Status: She is alert and oriented to person, place, and time.  Psychiatric:        Behavior: Behavior normal.    Previous notes and tests were reviewed. The plan was  reviewed with the patient/family, and all questions/concerned were addressed.  It was my pleasure to see Colleen House today and participate in her care. Please feel free to contact me with any questions or concerns.  Sincerely,  Wyline Mood, DO Allergy & Immunology  [facility] office: [phone] [facility] office: [phone] [place] office: [phone]

## 2020-06-13 ENCOUNTER — Ambulatory Visit: Payer: BC Managed Care – PPO | Admitting: Allergy

## 2020-06-17 ENCOUNTER — Encounter: Payer: Self-pay | Admitting: Allergy

## 2020-06-17 ENCOUNTER — Ambulatory Visit: Payer: BC Managed Care – PPO | Admitting: Allergy

## 2020-06-17 ENCOUNTER — Other Ambulatory Visit: Payer: Self-pay

## 2020-06-17 VITALS — BP 126/82 | HR 67 | Temp 98.6°F | Resp 16

## 2020-06-17 DIAGNOSIS — J069 Acute upper respiratory infection, unspecified: Secondary | ICD-10-CM | POA: Diagnosis not present

## 2020-06-17 DIAGNOSIS — J3089 Other allergic rhinitis: Secondary | ICD-10-CM | POA: Diagnosis not present

## 2020-06-17 DIAGNOSIS — J4540 Moderate persistent asthma, uncomplicated: Secondary | ICD-10-CM

## 2020-06-17 NOTE — Patient Instructions (Addendum)
Upper respiratory infection:  Please get COVID-19 testing and isolate until results are back.  You can get testing at [facility] or [facility] - make appointment.   Rest, drink plenty of fluids.  Take vitamin C daily.   Take zyrtec-D to help with nasal congestion.  May take tylenol/advil/excedrin for headaches.   No need for antibiotics or steroids for now.   Breathing:  . Start Breo 1 puff daily and rinse mouth after each use - sample given. Good for 14 days.  . Daily controller medication(s):  Marland Kitchen Continue Singulair (montelukast) 10mg  daily. . May use levoalbuterol rescue inhaler 2 puffs every 4 to 6 hours as needed for shortness of breath, chest tightness, coughing, and wheezing. May use levoalbuterol rescue inhaler 2 puffs 5 to 15 minutes prior to strenuous physical activities. Monitor frequency of use.  . Breathing control goals:  o Full participation in all desired activities (may need albuterol before activity) o Albuterol use two times or less a week on average (not counting use with activity) o Cough interfering with sleep two times or less a month o Oral steroids no more than once a year o No hospitalizations  Environmental allergies:  Past skin testing was positive to weed and perennial mold mix.   Continue environmental control measures.  May use over the counter antihistamines such as Zyrtec (cetirizine), Claritin (loratadine), Allegra (fexofenadine), or Xyzal (levocetirizine) daily as needed.  Use dymista (fluticasone + azelastine nasal spray combination) 1 spray per nostril 1-2 times a day as needed.   Nasal saline spray (i.e., Simply Saline) or nasal saline lavage (i.e., NeilMed) is recommended as needed and prior to medicated nasal sprays.  Follow up in 2 months or sooner if needed.   Reducing Pollen Exposure . Pollen seasons: trees (spring), grass (summer) and ragweed/weeds (fall). [birth year]1981 Keep windows closed in your home and car to lower  pollen exposure.  Marland Kitchen air conditioning in the bedroom and throughout the house if possible.  . Avoid going out in dry windy days - especially early morning. . Pollen counts are highest between 5 - 10 AM and on dry, hot and windy days.  . Save outside activities for late afternoon or after a heavy rain, when pollen levels are lower.  . Avoid mowing of grass if you have grass pollen allergy. Lilian Kapur Be aware that pollen can also be transported indoors on people and pets.  . Dry your clothes in an automatic dryer rather than hanging them outside where they might collect pollen.  . Rinse hair and eyes before bedtime.  Mold Control . Mold and fungi can grow on a variety of surfaces provided certain temperature and moisture conditions exist.  . Outdoor molds grow on plants, decaying vegetation and soil. The major outdoor mold, Alternaria and Cladosporium, are found in very high numbers during hot and dry conditions. Generally, a late summer - fall peak is seen for common outdoor fungal spores. Rain will temporarily lower outdoor mold spore count, but counts rise rapidly when the rainy period ends. . The most important indoor molds are Aspergillus and Penicillium. Dark, humid and poorly ventilated basements are ideal sites for mold growth. The next most common sites of mold growth are the bathroom and the kitchen. Outdoor (Seasonal) Mold Control . Use air conditioning and keep windows closed. . Avoid exposure to decaying vegetation. 07-24-1976 Avoid leaf raking. . Avoid grain handling. . Consider wearing a face mask if working in moldy areas.  Indoor (Perennial) Mold  Control  . Maintain humidity below 50%. . Get rid of mold growth on hard surfaces with water, detergent and, if necessary, 5% bleach (do not mix with other cleaners). Then dry the area completely. If mold covers an area more than 10 square feet, consider hiring an indoor environmental professional. . For clothing, washing with soap and water is  best. If moldy items cannot be cleaned and dried, throw them away. . Remove sources e.g. contaminated carpets. . Repair and seal leaking roofs or pipes. Using dehumidifiers in damp basements may be helpful, but empty the water and clean units regularly to prevent mildew from forming. All rooms, especially basements, bathrooms and kitchens, require ventilation and cleaning to deter mold and mildew growth. Avoid carpeting on concrete or damp floors, and storing items in damp areas.

## 2020-06-17 NOTE — Progress Notes (Signed)
Follow Up Note  RE: Colleen House MRN: [id number] DOB: 15-May-1966 Date of Office Visit: 06/17/2020  Referring provider: Gweneth Dimitri, MD Primary care provider: Gweneth Dimitri, MD  Chief Complaint: Sore Throat, Fatigue, and Cough  History of Present Illness: I had the pleasure of seeing Colleen House for a follow up visit at the [facility] on 06/17/2020. She is a 54 y.o. female, who is being followed for asthma and allergic rhinitis. Her previous allergy office visit was on 03/12/2020 with Dr. Selena Batten. Today is a new complaint visit of sick visit. Up to date with COVID-19 vaccine: yes.  Patient was seen in the office but HPI and plan was done over the phone. Physical exam done in person.   On September 10th 2021, patient started with sore throat and thought it was just her fall allergies as they tend to flare every fall. However, now she is also having some shortness of breath, feeling tired, having headaches.  Patient is not getting better at all.  No fevers or chills. 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  No Covid-19 testing.   Currently using Albuterol as needed and Mucinex, zyrtec and azelastine nasal sprays as needed. No steroid inhalers at home.  Still taking Singulair daily.  Assessment and Plan: Colleen House is a 54 y.o. female with: Viral upper respiratory infection 10 days of sore throat, fatigue, shortness of breath and headaches. Denies fevers/chills. 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  Please get COVID-19 testing and isolate until results are back - unfortunately it may be too late to get a positive result as she is on day 10 of symptoms.   Rest, drink plenty of fluids.  Take vitamin C daily.   Take zyrtec-D to help with nasal congestion.  May take Tylenol/Advil/Excedrin for headaches.   No need for antibiotics or steroids for now.   Other allergic rhinitis Past history - Rhinitis symptoms  with PND for many years with worsening in the spring and fall. Using Singulair, zyrtec and Flonase as needed with some benefit. 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 Negative to common foods. Interim history - only using dymista once a day.   Continue environmental control measures.  May use over the counter antihistamines such as Zyrtec (cetirizine), Claritin (loratadine), Allegra (fexofenadine), or Xyzal (levocetirizine) daily as needed.  Use dymista (fluticasone + azelastine nasal spray combination) 1 spray per nostril 1-2 times a day as needed.   Nasal saline spray (i.e., Simply Saline) or nasal saline lavage (i.e., NeilMed) is recommended as needed and prior to medicated nasal sprays.  Moderate persistent asthma without complication Past history - Diagnosed with asthma over 20+ years ago and was doing well up until the last month with coughing and chest tightness mainly. This episode may have started in the fall after her bronchitis flare and never completely resolved. Currently on Breztri, Singulair and xopenex with unknown benefit. 2 courses of prednisone this year which helped for a few days at most. No history of reflux. Normal CXR on 01/10/20 per patient report. Up to date with COVID-19 vaccinations. 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 Negative to common foods. Spirometry showed: normal pattern with no improvement in FEV1 post bronchodilator treatment. Normal EKG and ENT evaluation unremarkable. Interim history - issues with shortness of breath. . Start Breo 1 puff daily and rinse mouth after each use -  sample given. . Daily controller medication(s):  Marland Kitchen Continue Singulair (montelukast) 10mg  daily. . May use levoalbuterol rescue inhaler 2 puffs every 4 to 6 hours as needed for shortness of breath, chest tightness, coughing, and wheezing. May use levoalbuterol rescue inhaler 2 puffs 5 to 15 minutes prior to strenuous physical  activities. Monitor frequency of use.  . Will get spirometry at next visit instead of today due to COVID-19 pandemic and trying to minimize any type of aerosolizing procedures at this time in the office.   Return in about 2 months (around 08/17/2020).  Diagnostics: None.  Medication List:  Current Outpatient Medications  Medication Sig Dispense Refill  . albuterol (PROVENTIL HFA;VENTOLIN HFA) 108 (90 BASE) MCG/ACT inhaler Inhale 2 puffs into the lungs every 6 (six) hours as needed for wheezing.     . Azelastine-Fluticasone 137-50 MCG/ACT SUSP Place 1 spray into the nose in the morning and at bedtime. 23 g 5  . calcium carbonate (OSCAL) 1500 (600 Ca) MG TABS tablet Take by mouth.    . eletriptan (RELPAX) 20 MG tablet Take 20 mg by mouth as needed for migraine. One tablet by mouth at onset of headache. May repeat in 2 hours if headache persists or recurs.    08/19/2020 MAINTENANCE PACK 4 MCG INST USE 1  INSERT TWO TIMES A WEEK VAGINAL 90 DAYS    . LamoTRIgine 250 MG TB24 24 hour tablet Take 1 tablet by mouth daily.    . montelukast (SINGULAIR) 10 MG tablet Take 10 mg by mouth daily.    Creig Hines nystatin (MYCOSTATIN) 100000 UNIT/ML suspension      No current facility-administered medications for this visit.   Allergies: Allergies  Allergen Reactions  . Erythromycin     Made her chest hurt  . Venlafaxine Other (See Comments)    "Hard to taper" per patient report   I reviewed her past medical history, social history, family history, and environmental history and no significant changes have been reported from her previous visit.  Review of Systems  Constitutional: Positive for fatigue. Negative for appetite change, chills, fever and unexpected weight change.  HENT: Positive for congestion and sore throat. Negative for postnasal drip and rhinorrhea.   Eyes: Negative for itching.  Respiratory: Positive for cough and shortness of breath. Negative for chest tightness and wheezing.    Cardiovascular: Negative for chest pain.  Gastrointestinal: Negative for abdominal pain.  Genitourinary: Negative for difficulty urinating.  Skin: Negative for rash.  Allergic/Immunologic: Positive for environmental allergies. Negative for food allergies.  Neurological: Positive for headaches.   Objective: BP 126/82   Pulse 67   Temp 98.6 F (37 C) (Temporal)   Resp 16   LMP 05/15/2013   SpO2 97%  There is no height or weight on file to calculate BMI. Physical Exam Vitals and nursing note reviewed.  Constitutional:      Appearance: Normal appearance. She is well-developed.  HENT:     Head: Normocephalic and atraumatic.     Right Ear: Tympanic membrane and external ear normal.     Left Ear: Tympanic membrane and external ear normal.     Mouth/Throat:     Mouth: Mucous membranes are moist.     Pharynx: Oropharynx is clear.  Eyes:     Conjunctiva/sclera: Conjunctivae normal.  Cardiovascular:     Rate and Rhythm: Normal rate and regular rhythm.     Heart sounds: Normal heart sounds. No murmur heard.  No friction rub. No gallop.   Pulmonary:  Effort: Pulmonary effort is normal.     Breath sounds: Normal breath sounds. No wheezing, rhonchi or rales.  Musculoskeletal:     Cervical back: Neck supple.  Skin:    General: Skin is warm.     Findings: No rash.  Neurological:     Mental Status: She is alert and oriented to person, place, and time.  Psychiatric:        Behavior: Behavior normal.    Previous notes and tests were reviewed. The plan was reviewed with the patient/family, and all questions/concerned were addressed.  It was my pleasure to see Colleen House today and participate in her care. Please feel free to contact me with any questions or concerns.  Sincerely,  Wyline Mood, DO Allergy & Immunology  [facility] office: [phone] [facility] office: [phone] [place] office: [phone]

## 2020-06-17 NOTE — Progress Notes (Signed)
Left voicemail to schedule 2 month follow up.

## 2020-06-17 NOTE — Assessment & Plan Note (Signed)
10 days of sore throat, fatigue, shortness of breath and headaches. Denies fevers/chills. 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  Please get COVID-19 testing and isolate until results are back - unfortunately it may be too late to get a positive result as she is on day 10 of symptoms.   Rest, drink plenty of fluids.  Take vitamin C daily.   Take zyrtec-D to help with nasal congestion.  May take Tylenol/Advil/Excedrin for headaches.   No need for antibiotics or steroids for now.

## 2020-06-17 NOTE — Assessment & Plan Note (Signed)
Past history - Diagnosed with asthma over 20+ years ago and was doing well up until the last month with coughing and chest tightness mainly. This episode may have started in the fall after her bronchitis flare and never completely resolved. Currently on Breztri, Singulair and xopenex with unknown benefit. 2 courses of prednisone this year which helped for a few days at most. No history of reflux. Normal CXR on 01/10/20 per patient report. Up to date with COVID-19 vaccinations. 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 Negative to common foods. Spirometry showed: normal pattern with no improvement in FEV1 post bronchodilator treatment. Normal EKG and ENT evaluation unremarkable. Interim history - issues with shortness of breath. . Start Breo 1 puff daily and rinse mouth after each use - sample given. . Daily controller medication(s):  Marland Kitchen Continue Singulair (montelukast) 10mg  daily. . May use levoalbuterol rescue inhaler 2 puffs every 4 to 6 hours as needed for shortness of breath, chest tightness, coughing, and wheezing. May use levoalbuterol rescue inhaler 2 puffs 5 to 15 minutes prior to strenuous physical activities. Monitor frequency of use.  . Will get spirometry at next visit instead of today due to COVID-19 pandemic and trying to minimize any type of aerosolizing procedures at this time in the office.

## 2020-06-17 NOTE — Assessment & Plan Note (Signed)
Past history - Rhinitis symptoms with PND for many years with worsening in the spring and fall. Using Singulair, zyrtec and Flonase as needed with some benefit. 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 Negative to common foods. Interim history - only using dymista once a day.   Continue environmental control measures.  May use over the counter antihistamines such as Zyrtec (cetirizine), Claritin (loratadine), Allegra (fexofenadine), or Xyzal (levocetirizine) daily as needed.  Use dymista (fluticasone + azelastine nasal spray combination) 1 spray per nostril 1-2 times a day as needed.   Nasal saline spray (i.e., Simply Saline) or nasal saline lavage (i.e., NeilMed) is recommended as needed and prior to medicated nasal sprays.

## 2020-06-21 ENCOUNTER — Encounter: Payer: Self-pay | Admitting: Neurology

## 2020-06-21 ENCOUNTER — Other Ambulatory Visit: Payer: Self-pay

## 2020-06-21 DIAGNOSIS — R202 Paresthesia of skin: Secondary | ICD-10-CM

## 2020-06-26 ENCOUNTER — Ambulatory Visit (INDEPENDENT_AMBULATORY_CARE_PROVIDER_SITE_OTHER): Payer: BC Managed Care – PPO | Admitting: Neurology

## 2020-06-26 ENCOUNTER — Other Ambulatory Visit: Payer: Self-pay

## 2020-06-26 DIAGNOSIS — R202 Paresthesia of skin: Secondary | ICD-10-CM | POA: Diagnosis not present

## 2020-06-26 DIAGNOSIS — G5602 Carpal tunnel syndrome, left upper limb: Secondary | ICD-10-CM

## 2020-06-26 NOTE — Procedures (Signed)
[facility]  [address] [place], [address]  [place], [place] [address] Tel: [phone] Fax:  [phone] Test Date:  06/26/2020  Patient: Colleen House DOB: 06-12-1966 Physician: Nita Sickle, DO  Sex: Female Height: 5\' 3"  Ref Phys:  ID#: Evangeline Dakin Temp: 32.0C Technician:    Patient Complaints: This is a 54 year old female with history of right CTS release referred for evaluation of bilateral hand paresthesias.  NCV & EMG Findings: Tensive electrodiagnostic testing of the right upper extremity and additional studies of the left shows:  1. Left median sensory response shows prolonged latency (4.6 ms).  Right median, right mixed palmar, and bilateral ulnar sensory responses are within normal limits. 2. The right median and bilateral ulnar motor responses are within normal limits. 3. There is no evidence of active or chronic motor axonal loss changes affecting any of the tested muscles.  Motor unit configuration and recruitment pattern is within normal limits.  Impression: 1. 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   2. There is no evidence of right carpal tunnel syndrome or cervical radiculopathy affecting either upper extremity.   ___________________________ 53, DO    Nerve Conduction Studies Anti Sensory Summary Table   Stim Site NR Peak (ms) Norm Peak (ms) P-T Amp (V) Norm P-T Amp  Left Median Anti Sensory (2nd Digit)  32C  Wrist    4.6 <3.6 24.6 >15  Right Median Anti Sensory (2nd Digit)  32C  Wrist    2.9 <3.6 47.1 >15  Left Ulnar Anti Sensory (5th Digit)  32C  Wrist    2.5 <3.1 39.2 >10  Right Ulnar Anti Sensory (5th Digit)  32C  Wrist    2.5 <3.1 37.5 >10   Motor Summary Table   Stim Site NR Onset (ms) Norm Onset (ms) O-P Amp (mV) Norm O-P Amp Site1 Site2 Delta-0 (ms) Dist (cm) Vel (m/s) Norm Vel (m/s)  Left Median Motor (Abd Poll Brev)   32C  Wrist    5.2 <4.0 8.8 >6 Elbow Wrist 4.8 27.0 56 >50  Elbow    10.0  8.5         Right Median Motor (Abd Poll Brev)  32C  Wrist    3.3 <4.0 7.5 >6 Elbow Wrist 4.7 26.0 55 >50  Elbow    8.0  6.9         Left Ulnar Motor (Abd Dig Minimi)  32C  Wrist    2.4 <3.1 8.6 >7 B Elbow Wrist 3.9 22.0 56 >50  B Elbow    6.3  8.2  A Elbow B Elbow 1.6 10.0 62 >50  A Elbow    7.9  8.1         Right Ulnar Motor (Abd Dig Minimi)  32C  Wrist    2.1 <3.1 9.7 >7 B Elbow Wrist 3.6 21.0 58 >50  B Elbow    5.7  9.3  A Elbow B Elbow 1.7 10.0 59 >50  A Elbow    7.4  9.1          Comparison Summary Table   Stim Site NR Peak (ms) Norm Peak (ms) P-T Amp (V) Site1 Site2 Delta-P (ms) Norm Delta (ms)  Right Median/Ulnar Palm Comparison (Wrist - 8cm)  32C  Median Palm    1.7 <2.2 82.0 Median Palm Ulnar Palm 0.2   Ulnar Palm    1.5 <2.2 29.7  EMG   Side Muscle Ins Act Fibs Psw Fasc Number Recrt Dur Dur. Amp Amp. Poly Poly. Comment  Right 1stDorInt Nml Nml Nml Nml Nml Nml Nml Nml Nml Nml Nml Nml N/A  Right Abd Poll Brev Nml Nml Nml Nml Nml Nml Nml Nml Nml Nml Nml Nml N/A  Right PronatorTeres Nml Nml Nml Nml Nml Nml Nml Nml Nml Nml Nml Nml N/A  Right Biceps Nml Nml Nml Nml Nml Nml Nml Nml Nml Nml Nml Nml N/A  Right Triceps Nml Nml Nml Nml Nml Nml Nml Nml Nml Nml Nml Nml N/A  Right Deltoid Nml Nml Nml Nml Nml Nml Nml Nml Nml Nml Nml Nml N/A  Left 1stDorInt Nml Nml Nml Nml Nml Nml Nml Nml Nml Nml Nml Nml N/A  Left Abd Poll Brev Nml Nml Nml Nml Nml Nml Nml Nml Nml Nml Nml Nml N/A  Left PronatorTeres Nml Nml Nml Nml Nml Nml Nml Nml Nml Nml Nml Nml N/A  Left Biceps Nml Nml Nml Nml Nml Nml Nml Nml Nml Nml Nml Nml N/A  Left Triceps Nml Nml Nml Nml Nml Nml Nml Nml Nml Nml Nml Nml N/A  Left Deltoid Nml Nml Nml Nml Nml Nml Nml Nml Nml Nml Nml Nml N/A      Waveforms:

## 2020-07-01 ENCOUNTER — Ambulatory Visit: Payer: BC Managed Care – PPO | Admitting: Allergy

## 2020-07-23 ENCOUNTER — Encounter: Payer: BC Managed Care – PPO | Admitting: Neurology

## 2020-09-04 ENCOUNTER — Ambulatory Visit: Payer: BC Managed Care – PPO | Admitting: Allergy

## 2020-09-17 NOTE — Progress Notes (Signed)
Follow Up Note  RE: Colleen House MRN: [id number] DOB: Jun 28, 1966 Date of Office Visit: 09/18/2020  Referring provider: Gweneth Dimitri, MD Primary care provider: Jarrett Soho, PA-C  Chief Complaint: Asthma (Doing well. Her shortness of breath seems to be triggered by anxiety. She says that she is really short of breath when her anxiety is bad. )  History of Present Illness: I had the pleasure of seeing Colleen House for a follow up visit at the [facility] on 09/18/2020. She is a 54 y.o. female, who is being followed for asthma, allergic rhinitis. Her previous allergy office visit was on 06/17/2020 with Dr. Selena Batten. Today is a regular follow up visit.  Other allergic rhinitis Using dymista or Mucinex only if needed with good benefit. Uses zyrtec prn with good benefit.   Moderate persistent asthma  Noticed some chest tightness with increased anxiety/stress. Patient does see counselor for this and is on medications.  Tried levoalbuterol during these episodes with no benefit. Describes it as a consistent chest tightness. Denies any heartburn/reflux issues.  Currently only taking Singulair daily and not sure if it's helping.   Denies any ER/urgent care visits or prednisone use since the last visit.  Assessment and Plan: Colleen House is a 54 y.o. female with: Mild intermittent asthma without complication Past history - Diagnosed with asthma over 20+ years ago and was doing well up until the last month with coughing and chest tightness mainly. This episode may have started in the fall after her bronchitis flare and never completely resolved. Tried Breztri, Singulair and xopenex with unknown benefit. 2 courses of prednisone this year which helped for a few days at most. No history of reflux. Normal CXR on 01/10/20 per patient report. Up to date with COVID-19 vaccinations. 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 Negative to common foods. Spirometry showed:  normal pattern with no improvement in FEV1 post bronchodilator treatment. Normal EKG and ENT evaluation unremarkable. Interim history - chest tightness with increased anxiety/stress. Xopenex does not help.  . Daily controller medication(s): STOP Singulair. o If you notice worsening symptoms then restart.  . May use levoalbuterol rescue inhaler 2 puffs every 4 to 6 hours as needed for shortness of breath, chest tightness, coughing, and wheezing. May use levoalbuterol rescue inhaler 2 puffs 5 to 15 minutes prior to strenuous physical activities. Monitor frequency of use.  . Repeat spirometry at next visit. . Continue treatment for anxiety/stress.   Other allergic rhinitis Past history - Rhinitis symptoms with PND for many years with worsening in the spring and fall. Using Singulair, zyrtec and Flonase as needed with some benefit. 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 Negative to common foods. Interim history - only using medications if needed.   Continue environmental control measures.  May use over the counter antihistamines such as Zyrtec (cetirizine), Claritin (loratadine), Allegra (fexofenadine), or Xyzal (levocetirizine) daily as needed.  Use dymista (fluticasone + azelastine nasal spray combination) 1 spray per nostril 1-2 times a day as needed.   Nasal saline spray (i.e., Simply Saline) or nasal saline lavage (i.e., NeilMed) is recommended as needed and prior to medicated nasal sprays.  Return in about 4 months (around 01/17/2021).  Diagnostics: Spirometry:  Tracings reviewed. Her effort: Good reproducible efforts. FVC: 2.94L FEV1: 2.26L, 87% predicted FEV1/FVC ratio: 77% Interpretation: Spirometry consistent with normal pattern.  Please see scanned spirometry results for details.  Medication List:  Current Outpatient Medications  Medication Sig  Dispense Refill  . Azelastine-Fluticasone 137-50 MCG/ACT SUSP Place 1 spray into the nose in the morning and  at bedtime. 23 g 5  . Calcium Carbonate-Vit D-Min (CALTRATE 600+D PLUS PO) Take by mouth.    . eletriptan (RELPAX) 20 MG tablet Take 20 mg by mouth as needed for migraine. One tablet by mouth at onset of headache. May repeat in 2 hours if headache persists or recurs.    Marland Kitchen escitalopram (LEXAPRO) 20 MG tablet Take 30 mg by mouth.    Creig Hines MAINTENANCE PACK 4 MCG INST USE 1  INSERT TWO TIMES A WEEK VAGINAL 90 DAYS    . LamoTRIgine 250 MG TB24 24 hour tablet Take 1 tablet by mouth daily.    Marland Kitchen levalbuterol (XOPENEX HFA) 45 MCG/ACT inhaler 1-2 puffs as needed     No current facility-administered medications for this visit.   Allergies: Allergies  Allergen Reactions  . Erythromycin     Made her chest hurt  . Venlafaxine Other (See Comments)    "Hard to taper" per patient report   I reviewed her past medical history, social history, family history, and environmental history and no significant changes have been reported from her previous visit.  Review of Systems  Constitutional: Negative for appetite change, chills, fatigue, fever and unexpected weight change.  HENT: Negative for congestion, postnasal drip, rhinorrhea and sore throat.   Eyes: Negative for itching.  Respiratory: Positive for chest tightness. Negative for cough, shortness of breath and wheezing.   Cardiovascular: Negative for chest pain.  Gastrointestinal: Negative for abdominal pain.  Genitourinary: Negative for difficulty urinating.  Skin: Negative for rash.  Allergic/Immunologic: Positive for environmental allergies. Negative for food allergies.  Neurological: Negative for headaches.   Objective: BP 100/60 (BP Location: Right Arm, Patient Position: Sitting, Cuff Size: Normal)   Pulse 64   Temp 98 F (36.7 C) (Temporal)   Resp 16   Ht 5\' 3"  (1.6 m)   Wt 150 lb (68 kg)   LMP 05/15/2013   SpO2 98%   BMI 26.57 kg/m  Body mass index is 26.57 kg/m. Physical Exam Vitals and nursing note reviewed.  Constitutional:       Appearance: Normal appearance. She is well-developed.  HENT:     Head: Normocephalic and atraumatic.     Right Ear: Tympanic membrane and external ear normal.     Left Ear: Tympanic membrane and external ear normal.     Nose: Nose normal.     Mouth/Throat:     Mouth: Mucous membranes are moist.     Pharynx: Oropharynx is clear.  Eyes:     Conjunctiva/sclera: Conjunctivae normal.  Cardiovascular:     Rate and Rhythm: Normal rate and regular rhythm.     Heart sounds: Normal heart sounds. No murmur heard. No friction rub. No gallop.   Pulmonary:     Effort: Pulmonary effort is normal.     Breath sounds: Normal breath sounds. No wheezing, rhonchi or rales.  Musculoskeletal:     Cervical back: Neck supple.  Skin:    General: Skin is warm.     Findings: No rash.  Neurological:     Mental Status: She is alert and oriented to person, place, and time.  Psychiatric:        Behavior: Behavior normal.    Previous notes and tests were reviewed. The plan was reviewed with the patient/family, and all questions/concerned were addressed.  It was my pleasure to see Colleen House today and participate in her  care. Please feel free to contact me with any questions or concerns.  Sincerely,  Rexene Alberts, DO Allergy & Immunology  [facility] office: [place] office: [phone]

## 2020-09-18 ENCOUNTER — Ambulatory Visit: Payer: BC Managed Care – PPO | Admitting: Allergy

## 2020-09-18 ENCOUNTER — Other Ambulatory Visit: Payer: Self-pay

## 2020-09-18 ENCOUNTER — Encounter: Payer: Self-pay | Admitting: Allergy

## 2020-09-18 VITALS — BP 100/60 | HR 64 | Temp 98.0°F | Resp 16 | Ht 63.0 in | Wt 150.0 lb

## 2020-09-18 DIAGNOSIS — J4520 Mild intermittent asthma, uncomplicated: Secondary | ICD-10-CM | POA: Insufficient documentation

## 2020-09-18 DIAGNOSIS — J3089 Other allergic rhinitis: Secondary | ICD-10-CM | POA: Diagnosis not present

## 2020-09-18 NOTE — Assessment & Plan Note (Signed)
Past history - Rhinitis symptoms with PND for many years with worsening in the spring and fall. Using Singulair, zyrtec and Flonase as needed with some benefit. 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 Negative to common foods. Interim history - only using medications if needed.   Continue environmental control measures.  May use over the counter antihistamines such as Zyrtec (cetirizine), Claritin (loratadine), Allegra (fexofenadine), or Xyzal (levocetirizine) daily as needed.  Use dymista (fluticasone + azelastine nasal spray combination) 1 spray per nostril 1-2 times a day as needed.   Nasal saline spray (i.e., Simply Saline) or nasal saline lavage (i.e., NeilMed) is recommended as needed and prior to medicated nasal sprays.

## 2020-09-18 NOTE — Assessment & Plan Note (Signed)
Past history - Diagnosed with asthma over 20+ years ago and was doing well up until the last month with coughing and chest tightness mainly. This episode may have started in the fall after her bronchitis flare and never completely resolved. Tried Breztri, Singulair and xopenex with unknown benefit. 2 courses of prednisone this year which helped for a few days at most. No history of reflux. Normal CXR on 01/10/20 per patient report. Up to date with COVID-19 vaccinations. 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 Negative to common foods. Spirometry showed: normal pattern with no improvement in FEV1 post bronchodilator treatment. Normal EKG and ENT evaluation unremarkable. Interim history - chest tightness with increased anxiety/stress. Xopenex does not help.  . Daily controller medication(s): STOP Singulair. o If you notice worsening symptoms then restart.  . May use levoalbuterol rescue inhaler 2 puffs every 4 to 6 hours as needed for shortness of breath, chest tightness, coughing, and wheezing. May use levoalbuterol rescue inhaler 2 puffs 5 to 15 minutes prior to strenuous physical activities. Monitor frequency of use.  . Repeat spirometry at next visit. . Continue treatment for anxiety/stress.

## 2020-09-18 NOTE — Patient Instructions (Addendum)
Breathing:  . Daily controller medication(s): STOP Singulair. o If you notice worsening symptoms then restart.  . May use levoalbuterol rescue inhaler 2 puffs every 4 to 6 hours as needed for shortness of breath, chest tightness, coughing, and wheezing. May use levoalbuterol rescue inhaler 2 puffs 5 to 15 minutes prior to strenuous physical activities. Monitor frequency of use.  . Breathing control goals:  o Full participation in all desired activities (may need albuterol before activity) o Albuterol use two times or less a week on average (not counting use with activity) o Cough interfering with sleep two times or less a month o Oral steroids no more than once a year o No hospitalizations  Environmental allergies:  Past skin testing was positive to weed and perennial mold mix.   Continue environmental control measures.  May use over the counter antihistamines such as Zyrtec (cetirizine), Claritin (loratadine), Allegra (fexofenadine), or Xyzal (levocetirizine) daily as needed.  Use dymista (fluticasone + azelastine nasal spray combination) 1 spray per nostril 1-2 times a day as needed.   Nasal saline spray (i.e., Simply Saline) or nasal saline lavage (i.e., NeilMed) is recommended as needed and prior to medicated nasal sprays.  Follow up in 4 months or sooner if needed.   Reducing Pollen Exposure . Pollen seasons: trees (spring), grass (summer) and ragweed/weeds (fall). Marland Kitchen Keep windows closed in your home and car to lower pollen exposure.  Lilian Kapur air conditioning in the bedroom and throughout the house if possible.  . Avoid going out in dry windy days - especially early morning. . Pollen counts are highest between 5 - 10 AM and on dry, hot and windy days.  . Save outside activities for late afternoon or after a heavy rain, when pollen levels are lower.  . Avoid mowing of grass if you have grass pollen allergy. Marland Kitchen Be aware that pollen can also be transported indoors on people and  pets.  . Dry your clothes in an automatic dryer rather than hanging them outside where they might collect pollen.  . Rinse hair and eyes before bedtime.  Mold Control . Mold and fungi can grow on a variety of surfaces provided certain temperature and moisture conditions exist.  . Outdoor molds grow on plants, decaying vegetation and soil. The major outdoor mold, Alternaria and Cladosporium, are found in very high numbers during hot and dry conditions. Generally, a late summer - fall peak is seen for common outdoor fungal spores. Rain will temporarily lower outdoor mold spore count, but counts rise rapidly when the rainy period ends. . The most important indoor molds are Aspergillus and Penicillium. Dark, humid and poorly ventilated basements are ideal sites for mold growth. The next most common sites of mold growth are the bathroom and the kitchen. Outdoor (Seasonal) Mold Control . Use air conditioning and keep windows closed. . Avoid exposure to decaying vegetation. Marland Kitchen Avoid leaf raking. . Avoid grain handling. . Consider wearing a face mask if working in moldy areas.  Indoor (Perennial) Mold Control  . Maintain humidity below 50%. . Get rid of mold growth on hard surfaces with water, detergent and, if necessary, 5% bleach (do not mix with other cleaners). Then dry the area completely. If mold covers an area more than 10 square feet, consider hiring an indoor environmental professional. . For clothing, washing with soap and water is best. If moldy items cannot be cleaned and dried, throw them away. . Remove sources e.g. contaminated carpets. . Repair and seal leaking roofs or  pipes. Using dehumidifiers in damp basements may be helpful, but empty the water and clean units regularly to prevent mildew from forming. All rooms, especially basements, bathrooms and kitchens, require ventilation and cleaning to deter mold and mildew growth. Avoid carpeting on concrete or damp floors, and storing items in  damp areas.

## 2021-01-08 ENCOUNTER — Telehealth: Payer: Self-pay | Admitting: Adult Health

## 2021-01-08 NOTE — Telephone Encounter (Signed)
Called to discuss with patient about COVID-19 symptoms and the use of one of the available treatments for those with mild to moderate Covid symptoms and at a high risk of hospitalization.  Pt appears to qualify for outpatient treatment due to co-morbid conditions and/or a member of an at-risk group in accordance with the FDA Emergency Use Authorization.     Unable to reach pt - LMOM, my chart message sent   Jorgen Wolfinger C [employer]   

## 2021-01-09 NOTE — Telephone Encounter (Addendum)
Called pt back and she is out of the window for orals and mab ( next infusion date is 4/18)  Cline Crock PA-C  [facility]

## 2021-01-13 ENCOUNTER — Ambulatory Visit: Payer: Self-pay | Admitting: Allergy

## 2021-01-29 ENCOUNTER — Other Ambulatory Visit: Payer: Self-pay | Admitting: Nurse Practitioner

## 2021-01-29 DIAGNOSIS — Z9189 Other specified personal risk factors, not elsewhere classified: Secondary | ICD-10-CM

## 2021-01-31 ENCOUNTER — Other Ambulatory Visit: Payer: Self-pay | Admitting: Nurse Practitioner

## 2021-01-31 DIAGNOSIS — G4482 Headache associated with sexual activity: Secondary | ICD-10-CM

## 2021-01-31 DIAGNOSIS — Z9189 Other specified personal risk factors, not elsewhere classified: Secondary | ICD-10-CM

## 2021-02-12 ENCOUNTER — Ambulatory Visit
Admission: RE | Admit: 2021-02-12 | Discharge: 2021-02-12 | Disposition: A | Discharge status: Home / Self Care | Payer: BC Managed Care – PPO | Source: Ambulatory Visit | Attending: Nurse Practitioner | Admitting: Nurse Practitioner

## 2021-02-12 DIAGNOSIS — G4482 Headache associated with sexual activity: Secondary | ICD-10-CM

## 2021-08-04 ENCOUNTER — Ambulatory Visit
Admission: RE | Admit: 2021-08-04 | Discharge: 2021-08-04 | Disposition: A | Discharge status: Home / Self Care | Payer: BC Managed Care – PPO | Source: Ambulatory Visit | Attending: Nurse Practitioner | Admitting: Nurse Practitioner

## 2021-08-04 ENCOUNTER — Ambulatory Visit: Payer: BC Managed Care – PPO

## 2021-08-04 ENCOUNTER — Other Ambulatory Visit: Payer: Self-pay

## 2021-08-04 DIAGNOSIS — Z9189 Other specified personal risk factors, not elsewhere classified: Secondary | ICD-10-CM

## 2022-07-20 ENCOUNTER — Other Ambulatory Visit: Payer: Self-pay | Admitting: Nurse Practitioner

## 2022-10-13 ENCOUNTER — Encounter: Payer: Self-pay | Admitting: Allergy

## 2022-10-13 ENCOUNTER — Ambulatory Visit (INDEPENDENT_AMBULATORY_CARE_PROVIDER_SITE_OTHER): Payer: 59 | Admitting: Allergy

## 2022-10-13 VITALS — BP 110/60 | HR 74 | Temp 97.9°F | Resp 18 | Ht 62.6 in | Wt 155.0 lb

## 2022-10-13 DIAGNOSIS — J4540 Moderate persistent asthma, uncomplicated: Secondary | ICD-10-CM

## 2022-10-13 DIAGNOSIS — J3089 Other allergic rhinitis: Secondary | ICD-10-CM

## 2022-10-13 DIAGNOSIS — K219 Gastro-esophageal reflux disease without esophagitis: Secondary | ICD-10-CM

## 2022-10-13 MED ORDER — OMEPRAZOLE 40 MG PO CPDR: 40.0000 mg | DELAYED_RELEASE_CAPSULE | Freq: Every day | ORAL | 0 refills | Status: DC

## 2022-10-13 MED ORDER — RYALTRIS 665-25 MCG/ACT NA SUSP: 1.0000 | Freq: Two times a day (BID) | NASAL | 5 refills | Status: AC

## 2022-10-13 MED ORDER — FLUTICASONE-SALMETEROL 115-21 MCG/ACT IN AERO: 2.0000 | INHALATION_SPRAY | Freq: Two times a day (BID) | RESPIRATORY_TRACT | 5 refills | Status: AC

## 2022-10-13 MED ORDER — LEVALBUTEROL TARTRATE 45 MCG/ACT IN AERO: 2.0000 | INHALATION_SPRAY | RESPIRATORY_TRACT | 2 refills | Status: AC | PRN

## 2022-10-13 NOTE — Assessment & Plan Note (Signed)
Past history - Rhinitis symptoms with PND for many years with worsening in the spring and fall. Using Singulair, zyrtec and Flonase as needed with some benefit. 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 Negative to common foods. Interim history - some PND. Continue environmental control measures as below. Use over the counter antihistamines such as Zyrtec (cetirizine), Claritin (loratadine), Allegra (fexofenadine), or Xyzal (levocetirizine) daily as needed. May take twice a day during allergy flares. May switch antihistamines every few months. Start Ryaltris (olopatadine + mometasone nasal spray combination) 1-2 sprays per nostril twice a day. Sample given. This replaces your other nasal sprays. If this works well for you, then have [facility] ship the medication to your home - prescription already sent in.  Nasal saline spray (i.e., Simply Saline) or nasal saline lavage (i.e., NeilMed) is recommended as needed and prior to medicated nasal sprays.

## 2022-10-13 NOTE — Progress Notes (Signed)
Follow Up Note  RE: Colleen House MRN: [id number] DOB: 15-Mar-1966 Date of Office Visit: 10/13/2022  Referring provider: Jarrett Soho, PA-C Primary care provider: Jarrett Soho, PA-C  Chief Complaint: Asthma (Has been affecting her ability to do things. Rochel Brome in December and was given 15 days of prednisone once finished the course her sx came back again. )  History of Present Illness: I had the pleasure of seeing Colleen House for a follow up visit [facility]  on 10/13/2022. She is a 57 y.o. female, who is being followed for asthma and allergic rhinitis. Her previous allergy office visit was on 09/18/2020 with Dr. Selena Batten. Today is a new complaint visit of not feeling well . Failed to follow up as recommended.  Asthma Patient had bronchitis in December 2022 and 2023 which flares her asthma. She had coughing, headaches, chest tightness.   She took prednisone for 15 days and some antibiotics with some benefit.  She has not been able to work out for the past 1 month.   Breztri caused thrush in the past.  Not sure if albuterol is effective.   Other allergic rhinitis Taking zyrtec daily. Used ipratropium which caused some dryness and nosebleeds.  Assessment and Plan: Colleen House is a 57 y.o. female with: Moderate persistent asthma without complication Past history - Diagnosed with asthma over 20+ years ago and was doing well up until the last month with coughing and chest tightness mainly. This episode may have started in the fall after her bronchitis flare and never completely resolved. Currently on Breztri, Singulair and xopenex with unknown benefit. 2 courses of prednisone this year which helped for a few days at most. No history of reflux. Normal CXR on 01/10/20 per patient report. Up to date with COVID-19 vaccinations. 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 Negative to common foods. Spirometry showed: normal pattern with no improvement  in FEV1 post bronchodilator treatment. Normal EKG and ENT evaluation unremarkable. Interim history - ever December has a flare requiring prednisone. Took prednisone and antibiotics last year and still having lingering cough. No fevers/chills. Denies reflux symptoms. Some PND. Today's spirometry - hard to interpret due to coughing with no improvement in FEV1 post bronchodilator treatment. Clinically feeling slightly improved.  Daily controller medication(s): start Advair 2 puffs twice a day with spacer and rinse mouth afterwards. Use Listerine alcohol free to rinse mouth. May use levoalbuterol rescue inhaler 2 puffs every 4 to 6 hours as needed for shortness of breath, chest tightness, coughing, and wheezing. Monitor frequency of use.  Get spirometry at next visit. Declined additional systemic steroids. If not feeling better in 2 weeks - will send in some antibiotics/prednisone next and get CXR.  Seasonal and perennial allergic rhinitis Past history - Rhinitis symptoms with PND for many years with worsening in the spring and fall. Using Singulair, zyrtec and Flonase as needed with some benefit. 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 Negative to common foods. Interim history - some PND. Continue environmental control measures as below. Use over the counter antihistamines such as Zyrtec (cetirizine), Claritin (loratadine), Allegra (fexofenadine), or Xyzal (levocetirizine) daily as needed. May take twice a day during allergy flares. May switch antihistamines every few months. Start Ryaltris (olopatadine + mometasone nasal spray combination) 1-2 sprays per nostril twice a day. Sample given. This replaces your other nasal sprays. If this works well for you, then have [facility] ship the medication to your  home - prescription already sent in.  Nasal saline spray (i.e., Simply Saline) or nasal saline lavage (i.e., NeilMed) is recommended as needed and prior to medicated nasal  sprays.  Gastroesophageal reflux disease See handout for lifestyle and dietary modifications. Take omeprazole 40mg  once a day in the morning. Nothing to eat or drink for 20-30 min afterwards. Take it for 2 weeks.  Return in about 2 months (around 12/12/2022).  Meds ordered this encounter  Medications   Olopatadine-Mometasone (RYALTRIS) 665-25 MCG/ACT SUSP    Sig: Place 1-2 sprays into the nose in the morning and at bedtime.    Dispense:  29 g    Refill:  5    [phone]   fluticasone-salmeterol (ADVAIR HFA) 115-21 MCG/ACT inhaler    Sig: Inhale 2 puffs into the lungs 2 (two) times daily. with spacer and rinse mouth afterwards.    Dispense:  1 each    Refill:  5   levalbuterol (XOPENEX HFA) 45 MCG/ACT inhaler    Sig: Inhale 2 puffs into the lungs every 4 (four) hours as needed for wheezing or shortness of breath (coughing fits).    Dispense:  1 each    Refill:  2   omeprazole (PRILOSEC) 40 MG capsule    Sig: Take 1 capsule (40 mg total) by mouth daily.    Dispense:  30 capsule    Refill:  0   Lab Orders  No laboratory test(s) ordered today    Diagnostics: Spirometry:  Tracings reviewed. Her effort: It was hard to get consistent efforts and there is a question as to whether this reflects a maximal maneuver. FVC: 3.64L FEV1: 2.55L, 108% predicted FEV1/FVC ratio: 70% Interpretation: No overt abnormalities noted given today's efforts with no improvement in FEV1 post bronchodilator treatment. Clinically feeling slightly improved.   Please see scanned spirometry results for details.  Medication List:  Current Outpatient Medications  Medication Sig Dispense Refill   Calcium Carbonate-Vit D-Min (CALTRATE 600+D PLUS PO) Take by mouth.     Cetirizine HCl (ZYRTEC ALLERGY PO) Take 10 mg by mouth daily.     eletriptan (RELPAX) 20 MG tablet Take 20 mg by mouth as needed for migraine. One tablet by mouth at onset of headache. May repeat in 2 hours if headache persists or recurs.      escitalopram (LEXAPRO) 20 MG tablet Take 30 mg by mouth.     fluticasone-salmeterol (ADVAIR HFA) 115-21 MCG/ACT inhaler Inhale 2 puffs into the lungs 2 (two) times daily. with spacer and rinse mouth afterwards. 1 each 5   IMVEXXY MAINTENANCE PACK 4 MCG INST USE 1  INSERT TWO TIMES A WEEK VAGINAL 90 DAYS     LamoTRIgine 250 MG TB24 24 hour tablet Take 1 tablet by mouth daily.     levalbuterol (XOPENEX HFA) 45 MCG/ACT inhaler Inhale 2 puffs into the lungs every 4 (four) hours as needed for wheezing or shortness of breath (coughing fits). 1 each 2   Olopatadine-Mometasone (RYALTRIS) 665-25 MCG/ACT SUSP Place 1-2 sprays into the nose in the morning and at bedtime. 29 g 5   omeprazole (PRILOSEC) 40 MG capsule Take 1 capsule (40 mg total) by mouth daily. 30 capsule 0   rosuvastatin (CRESTOR) 5 MG tablet Take 5 mg by mouth daily.     No current facility-administered medications for this visit.   Allergies: Allergies  Allergen Reactions   Erythromycin     Made her chest hurt   Venlafaxine Other (See Comments)    "Hard to taper" per  patient report   I reviewed her past medical history, social history, family history, and environmental history and no significant changes have been reported from her previous visit.  Review of Systems  Constitutional:  Negative for appetite change, chills, fever and unexpected weight change.  HENT:  Positive for postnasal drip. Negative for congestion and rhinorrhea.   Eyes:  Negative for itching.  Respiratory:  Positive for cough, chest tightness and shortness of breath. Negative for wheezing.   Gastrointestinal:  Negative for abdominal pain.  Skin:  Negative for rash.  Allergic/Immunologic: Positive for environmental allergies.  Neurological:  Negative for headaches.   Objective: BP 110/60   Pulse 74   Temp 97.9 F (36.6 C)   Resp 18   Ht 5' 2.6" (1.59 m)   Wt 155 lb (70.3 kg)   LMP 05/15/2013   SpO2 95%   BMI 27.81 kg/m  Body mass index is 27.81  kg/m. Physical Exam Vitals and nursing note reviewed.  Constitutional:      Appearance: Normal appearance. She is well-developed.  HENT:     Head: Normocephalic and atraumatic.     Right Ear: Tympanic membrane and external ear normal.     Left Ear: Tympanic membrane and external ear normal.     Nose: Nose normal.     Mouth/Throat:     Mouth: Mucous membranes are moist.     Pharynx: Oropharynx is clear.  Eyes:     Conjunctiva/sclera: Conjunctivae normal.  Cardiovascular:     Rate and Rhythm: Normal rate and regular rhythm.     Heart sounds: Normal heart sounds. No murmur heard. Pulmonary:     Effort: Pulmonary effort is normal.     Breath sounds: Normal breath sounds. No wheezing, rhonchi or rales.  Musculoskeletal:     Cervical back: Neck supple.  Skin:    General: Skin is warm.     Findings: No rash.  Neurological:     Mental Status: She is alert and oriented to person, place, and time.  Psychiatric:        Behavior: Behavior normal.    Previous notes and tests were reviewed. The plan was reviewed with the patient/family, and all questions/concerned were addressed.  It was my pleasure to see Colleen House today and participate in her care. Please feel free to contact me with any questions or concerns.  Sincerely,  Rexene Alberts, DO Allergy & Immunology  [facility] office: [place] office: [phone]

## 2022-10-13 NOTE — Patient Instructions (Addendum)
Breathing Daily controller medication(s): start Advair 146mcg 2 puffs twice a day with spacer and rinse mouth afterwards. Use Listerine alcohol free to rinse mouth. May use levoalbuterol rescue inhaler 2 puffs every 4 to 6 hours as needed for shortness of breath, chest tightness, coughing, and wheezing. Monitor frequency of use.  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 Continue environmental control measures as below. Use over the counter antihistamines such as Zyrtec (cetirizine), Claritin (loratadine), Allegra (fexofenadine), or Xyzal (levocetirizine) daily as needed. May take twice a day during allergy flares. May switch antihistamines every few months. Start Ryaltris (olopatadine + mometasone nasal spray combination) 1-2 sprays per nostril twice a day. Sample given. This replaces your other nasal sprays. If this works well for you, then have [facility] ship the medication to your home - prescription already sent in.  Nasal saline spray (i.e., Simply Saline) or nasal saline lavage (i.e., NeilMed) is recommended as needed and prior to medicated nasal sprays.  Heartburn: See handout for lifestyle and dietary modifications. Take omeprazole 40mg  once a day in the morning. Nothing to eat or drink for 20-30 min afterwards. Take it for 2 weeks.  Follow up in 2 months or sooner if needed.  If you are not feeling better after 2 weeks then let me know.

## 2022-10-13 NOTE — Assessment & Plan Note (Addendum)
Past history - Diagnosed with asthma over 20+ years ago and was doing well up until the last month with coughing and chest tightness mainly. This episode may have started in the fall after her bronchitis flare and never completely resolved. Currently on Breztri, Singulair and xopenex with unknown benefit. 2 courses of prednisone this year which helped for a few days at most. No history of reflux. Normal CXR on 01/10/20 per patient report. Up to date with COVID-19 vaccinations. 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 Negative to common foods. Spirometry showed: normal pattern with no improvement in FEV1 post bronchodilator treatment. Normal EKG and ENT evaluation unremarkable. Interim history - ever December has a flare requiring prednisone. Took prednisone and antibiotics last year and still having lingering cough. No fevers/chills. Denies reflux symptoms. Some PND. Today's spirometry - hard to interpret due to coughing with no improvement in FEV1 post bronchodilator treatment. Clinically feeling slightly improved.  Daily controller medication(s): start Advair 124mcg 2 puffs twice a day with spacer and rinse mouth afterwards. Use Listerine alcohol free to rinse mouth. May use levoalbuterol rescue inhaler 2 puffs every 4 to 6 hours as needed for shortness of breath, chest tightness, coughing, and wheezing. Monitor frequency of use.  Get spirometry at next visit. Declined additional systemic steroids. If not feeling better in 2 weeks - will send in some antibiotics/prednisone next and get CXR.

## 2022-10-13 NOTE — Assessment & Plan Note (Signed)
See handout for lifestyle and dietary modifications. Take omeprazole 40mg  once a day in the morning. Nothing to eat or drink for 20-30 min afterwards. Take it for 2 weeks.

## 2022-11-04 ENCOUNTER — Other Ambulatory Visit: Payer: Self-pay | Admitting: Allergy

## 2022-11-30 ENCOUNTER — Other Ambulatory Visit: Payer: Self-pay | Admitting: Nurse Practitioner

## 2022-11-30 DIAGNOSIS — Z1231 Encounter for screening mammogram for malignant neoplasm of breast: Secondary | ICD-10-CM

## 2022-12-02 ENCOUNTER — Ambulatory Visit
Admission: RE | Admit: 2022-12-02 | Discharge: 2022-12-02 | Disposition: A | Discharge status: Home / Self Care | Payer: 59 | Source: Ambulatory Visit | Attending: Nurse Practitioner | Admitting: Nurse Practitioner

## 2022-12-02 DIAGNOSIS — Z1231 Encounter for screening mammogram for malignant neoplasm of breast: Secondary | ICD-10-CM

## 2022-12-17 ENCOUNTER — Ambulatory Visit: Payer: 59 | Admitting: Allergy

## 2023-02-09 ENCOUNTER — Other Ambulatory Visit: Payer: Self-pay | Admitting: Nurse Practitioner

## 2023-02-09 DIAGNOSIS — M8588 Other specified disorders of bone density and structure, other site: Secondary | ICD-10-CM

## 2023-09-03 ENCOUNTER — Ambulatory Visit
Admission: RE | Admit: 2023-09-03 | Discharge: 2023-09-03 | Disposition: A | Discharge status: Home / Self Care | Payer: 59 | Source: Ambulatory Visit | Attending: Nurse Practitioner | Admitting: Nurse Practitioner

## 2023-09-03 DIAGNOSIS — M8588 Other specified disorders of bone density and structure, other site: Secondary | ICD-10-CM
